# Patient Record
Sex: Male | Born: 1955 | Hispanic: No | Marital: Married | Smoking: Current every day smoker
Health system: Southern US, Community
[De-identification: ages and names within clinical notes are randomized; demographics above are authoritative.]

## PROBLEM LIST (undated history)

## (undated) DIAGNOSIS — F17210 Nicotine dependence, cigarettes, uncomplicated: Secondary | ICD-10-CM

## (undated) DIAGNOSIS — G8929 Other chronic pain: Secondary | ICD-10-CM

## (undated) DIAGNOSIS — M79606 Pain in leg, unspecified: Secondary | ICD-10-CM

## (undated) HISTORY — DX: Pain in leg, unspecified: M79.606

## (undated) HISTORY — DX: Nicotine dependence, cigarettes, uncomplicated: F17.210

## (undated) HISTORY — DX: Other chronic pain: G89.29

---

## 2000-02-17 ENCOUNTER — Ambulatory Visit (HOSPITAL_COMMUNITY): Admission: RE | Admit: 2000-02-17 | Discharge: 2000-02-17 | Payer: Self-pay | Admitting: Gastroenterology

## 2005-10-15 ENCOUNTER — Emergency Department (HOSPITAL_COMMUNITY): Admission: EM | Admit: 2005-10-15 | Discharge: 2005-10-15 | Payer: Self-pay | Admitting: Emergency Medicine

## 2005-11-11 ENCOUNTER — Encounter: Admission: RE | Admit: 2005-11-11 | Discharge: 2005-12-15 | Payer: Self-pay | Admitting: Orthopedic Surgery

## 2007-04-12 ENCOUNTER — Emergency Department (HOSPITAL_COMMUNITY): Admission: EM | Admit: 2007-04-12 | Discharge: 2007-04-12 | Payer: Self-pay | Admitting: Emergency Medicine

## 2009-02-04 ENCOUNTER — Observation Stay (HOSPITAL_COMMUNITY): Admission: EM | Admit: 2009-02-04 | Discharge: 2009-02-05 | Payer: Self-pay | Admitting: Emergency Medicine

## 2009-02-25 ENCOUNTER — Encounter: Admission: RE | Admit: 2009-02-25 | Discharge: 2009-02-25 | Payer: Self-pay | Admitting: Family Medicine

## 2009-04-08 ENCOUNTER — Encounter: Admission: RE | Admit: 2009-04-08 | Discharge: 2009-04-08 | Payer: Self-pay | Admitting: Neurology

## 2010-09-11 LAB — CARDIAC PANEL(CRET KIN+CKTOT+MB+TROPI)
CK, MB: 1.7 ng/mL (ref 0.3–4.0)
Relative Index: INVALID (ref 0.0–2.5)

## 2010-09-11 LAB — CBC
HCT: 40.5 % (ref 39.0–52.0)
MCHC: 33.5 g/dL (ref 30.0–36.0)
MCV: 88.3 fL (ref 78.0–100.0)
Platelets: 197 10*3/uL (ref 150–400)

## 2010-09-11 LAB — BASIC METABOLIC PANEL
BUN: 8 mg/dL (ref 6–23)
Calcium: 8.6 mg/dL (ref 8.4–10.5)
Chloride: 107 mEq/L (ref 96–112)
Creatinine, Ser: 0.94 mg/dL (ref 0.4–1.5)
Sodium: 138 mEq/L (ref 135–145)

## 2010-09-11 LAB — DIFFERENTIAL
Basophils Absolute: 0 10*3/uL (ref 0.0–0.1)
Lymphocytes Relative: 19 % (ref 12–46)
Lymphs Abs: 2.1 10*3/uL (ref 0.7–4.0)
Monocytes Relative: 7 % (ref 3–12)
Neutrophils Relative %: 73 % (ref 43–77)

## 2010-09-12 LAB — DIFFERENTIAL
Basophils Absolute: 0 10*3/uL (ref 0.0–0.1)
Eosinophils Absolute: 0 10*3/uL (ref 0.0–0.7)
Eosinophils Relative: 0 % (ref 0–5)
Lymphocytes Relative: 6 % — ABNORMAL LOW (ref 12–46)
Lymphs Abs: 0.9 10*3/uL (ref 0.7–4.0)
Neutro Abs: 14.3 10*3/uL — ABNORMAL HIGH (ref 1.7–7.7)

## 2010-09-12 LAB — URINALYSIS, ROUTINE W REFLEX MICROSCOPIC
Hgb urine dipstick: NEGATIVE
Ketones, ur: NEGATIVE mg/dL
Nitrite: NEGATIVE
Protein, ur: 30 mg/dL — AB
Specific Gravity, Urine: 1.016 (ref 1.005–1.030)
pH: 7 (ref 5.0–8.0)

## 2010-09-12 LAB — COMPREHENSIVE METABOLIC PANEL
Albumin: 3.8 g/dL (ref 3.5–5.2)
Chloride: 103 mEq/L (ref 96–112)
Creatinine, Ser: 0.94 mg/dL (ref 0.4–1.5)
GFR calc Af Amer: >60 mL/min (ref >60)
GFR calc non Af Amer: >60 mL/min (ref >60)
Glucose, Bld: 122 mg/dL — ABNORMAL HIGH (ref 70–99)
Potassium: 4.4 mEq/L (ref 3.5–5.1)
Total Bilirubin: 0.8 mg/dL (ref 0.3–1.2)

## 2010-09-12 LAB — PROTIME-INR
INR: 1 (ref 0.00–1.49)
Prothrombin Time: 13.2 seconds (ref 11.6–15.2)

## 2010-09-12 LAB — POCT CARDIAC MARKERS
CKMB, poc: 2.3 ng/mL (ref 1.0–8.0)
Troponin i, poc: <0.05 ng/mL (ref 0.00–0.09)

## 2010-09-12 LAB — CBC
Hemoglobin: 15.2 g/dL (ref 13.0–17.0)
MCV: 88 fL (ref 78.0–100.0)
RBC: 5.11 MIL/uL (ref 4.22–5.81)
WBC: 15.8 10*3/uL — ABNORMAL HIGH (ref 4.0–10.5)

## 2010-09-12 LAB — URINE MICROSCOPIC-ADD ON

## 2010-09-12 LAB — CARDIAC PANEL(CRET KIN+CKTOT+MB+TROPI): CK, MB: 2 ng/mL (ref 0.3–4.0)

## 2010-10-20 NOTE — Discharge Summary (Signed)
NAMEDEMETRICE, Nathaniel George              ACCOUNT NO.:  [id number]   MEDICAL RECORD NO.:  [id number]          PATIENT TYPE:  OBV   LOCATION:  1511                         FACILITY:  [facility]   PHYSICIAN:  Richarda Overlie, MD       DATE OF BIRTH:  08-23-1955   DATE OF ADMISSION:  02/04/2009  DATE OF DISCHARGE:  02/05/2009                               DISCHARGE SUMMARY   PRIMARY CARE PHYSICIAN:  Dr. Azucena Cecil.   DISCHARGE DIAGNOSES:  1. Community acquired pneumonia.  2. Chronic vertigo.  3. Nicotine dependence.  4. Dehydration.   PROCEDURES:  CT scan of the head without contrast shows post occipital  craniotomy no acute interval change.  Chest x-ray done on February 04, 2009, shows suboptimal level of inspiration and slight interval increase  in bibasilar subsegmental atelectasis versus pneumonia.  MRI / MRA of  the brain results pending.  2-D echo results pending.   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  The patient was found to be mildly tachypneic  in the ER.  Initial blood work revealed leukocytosis.  When the patient  presented he also complained of near syncope and a spinning sensation.  Chest x-ray was consistent with pneumonia.  The patient also notes some  intercurrent fatigue and weakness ever since he was started on a new  antidepressant about 2 weeks ago.  The patient states that he has had  extensive prior workup for his vertigo except for an MRI of the brain  which was essentially negative.  He has never seen an ENT specialist.   HOSPITAL COURSE:  1. Community acquired pneumonia.  The patient was found to have      bibasilar infiltrates consistent with community acquired pneumonia,      in the setting of his elevated white count he was initiated on      Rocephin and azithromycin.  The patient  responded well to treatment      with decrease in his white blood cell count.  He was hydrated      aggressively with IV fluids.  His chest pain was thought to be      secondary to his pneumonia.  EKG was consistent with sinus rhythm      without any ST segment changes.  The patient definitely has risk      factors to develop, the patient definitely has risk factors to have      underlying coronary artery disease.  The patient would benefit from      an outpatient Cardiolite stress test in about 2-3 weeks.  This      needs to be arranged by the patient's primary care Nathaniel George.   1. Dizziness.  The patient has a history of dizziness which is      chronic.  He has never had an outpatient evaluation.  During his      hospital stay the patient had  stable neuro checks.  He was      ambulated without recurrence of his symptoms.  He is being started      on p.r.n. meclizine.  An MRI / MRA of the brain is also being done      prior to his discharge.  I am also going to obtain a 2-D echo to      rule out any cardiac cause of his underlying dizziness and fatigue.      There was no evidence of any dysrhythmias on his telemetry during      this hospitalization.   DISCHARGE MEDICATIONS:  1. Meclizine 12.5 mg p.o. q.6 hours.  2. Albuterol MDI 2 puffs q.4 hours.  3. Avelox 400 mg p.o. daily.  4. Nicotine patch 21 mg per day x2 weeks, 14 mg per day x 6 weeks, 7      mg per day x2 weeks.  5. Mucinex DM 600 mg p.o. b.i.d.  6. Celexa 20 mg p.o. daily.  7. Klonopin 0.25-0.5 mg twice daily as needed.  8. Fish oil.  9. Generic Darvocet-N one tablet p.o. q.6 hours.   FOLLOWUP:  1. The patient is to have an outpatient stress test in 2-3 weeks once      the patient has recovered from his pneumonia.  This is to be      arranged by the patient's primary care Nathaniel George.  2. Follow up with PCP, Dr. Tally George, in 5-7 days.      Richarda Overlie, MD  Electronically Signed     NA/MEDQ  D:  02/05/2009  T:   02/05/2009  Job:  [id number]   cc:   Nathaniel George, M.D.  Fax: [phone]

## 2010-10-20 NOTE — H&P (Signed)
Nathaniel George, Nathaniel NO.:  [id number]   MEDICAL RECORD NO.:  [id number]          PATIENT TYPE:  EMS   LOCATION:  ED                           FACILITY:  [facility]   PHYSICIAN:  Rito Ehrlich, [facility], Dr.DATE OF BIRTH:  02-21-1956   DATE OF ADMISSION:  02/04/2009  DATE OF DISCHARGE:                              HISTORY & PHYSICAL   PRIMARY CARE PHYSICIAN:  Tally Joe, M.D. with [facility].   CHIEF COMPLAINT:  Chest pain, weakness and dizziness.   HISTORY OF PRESENT ILLNESS:  Nathaniel George is a 55 year old male patient  with history of ongoing tobacco abuse, depression and anxiety.  He  presents to the ER today, was brought in by EMS after experiencing  weakness associated with diaphoresis while at work.  This was also  associated with near syncope and a spinning type sensation during the  dizziness.  The patient and wife both report that he had a similar  episode last year but apparently this one is worse and has caused him to  seek medical attention.  In addition, he reports that while at the beach  a few days ago he had some nonspecific chest pain while walking up the  stairs.  He thinks he may have been a little short of breath and tight  in his chest, but was very vague about his  symptomatology and does not  recall any radiation of the pain.  Apparently the pain resolved when he  stopped walking up steps.  The patient also reports that he has been  recently started on a new antidepressant about 2 weeks ago and has  noticed concurrent increasing fatigue symptoms since that time.  Upon  arrival to the ER the patient underwent an extensive workup involving a  CT of the head which showed no acute process.  EKG showed no acute  ischemic changes.  Labs revealed a leukocytosis with a white count of  15,800 and a neutrophil shift to 91%.  A two-view chest x-ray showed  segmental bibasilar atelectasis and/or atelectatic pneumonia.  Dr.  Ignacia Palma of the ER  has asked [place] to evaluate the patient for admission.   REVIEW OF SYSTEMS:  CONSTITUTIONAL:  The patient denies any myalgias,  fevers or chills, malaise/fatigue as described in the history of present  illness.  PSYCH:  As noted, the patient has started a new medication.  He has chronic anxiety and depression.  No new social stressors  reported.  NEURO:  Dizziness and vertigo-like spinning dizziness today  at work.  Does not have this chronically.  Has had no visual  disturbances.  No unilateral extremity weakness, numbness or tingling.  No facial asymmetry.  No seizures.  EYES/EARS/NOSE/THROAT.  He has had  no conjunctival type drainage from the eyes.  No ear pain or ear  popping.  No nasal drainage.  No sinus pressure.  CHEST.  He denies  cough, hemoptysis, shortness of breath except he has had some shortness  of breath as described in the history of present illness with the chest  pain while at the beach.  CARDIOVASCULAR:  Chest  pain as described at  the beach. No tachy palpitations.  No orthopnea.  No dyspnea on exertion  other than the again shortness of breath reported with the chest pain at  the beach.  No chest pain today.  ABDOMEN:  No nausea, vomiting, no  diarrhea.  No hematemesis, no hematochezia or melena.  No change in  bowel pattern.  MUSCULOSKELETAL:  Noncontributory to this exam.  LYMPHATICS:  Noncontributory to this exam.  HEMATOLOGY:  Noncontributory  to this exam.   SOCIAL HISTORY:  He admits to smoking at least one pack of cigarettes  per day.  No alcohol.  He is married.  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.   FAMILY HISTORY:  His biological mother has renal disease, Alzheimer's.   PAST MEDICAL HISTORY:  1. Anxiety and depression.  2. Ongoing tobacco abuse.   PAST SURGICAL HISTORY:  Repair of an Arnold Chiari syndrome in 1996.   ALLERGIES:  No known drug allergies.   CURRENT MEDICATIONS:  At home include:  1. Celexa 20 mg daily.   2. Clonazepam 0.25-0.5 mg b.i.d. as needed for anxiety.  Reports this      has been given to assist with any side effects from the newly      ordered Celexa.  3. Generic Darvocet as needed for pain and headache.  4. Fish oil daily.   PHYSICAL EXAM:  GENERAL:  Pleasant male patient reporting recent  dizziness, diaphoresis and near syncopal symptoms.  VITAL SIGNS:  Temperature 97.1, BP 136/92, pulse 79 and regular,  respirations 20.  PSYCH:  Patient with flat affect but eye contact is made.  He is alert,  oriented x3.  NEURO:  Cranial nerves II-XII are grossly intact without any focal  neurological deficits noted.  He is moving all extremities x4.  Gait,  ambulation and DTRs were not assessed.  No nystagmus was noted as well  on exam.  EYES:  Sclerae noninjected, nonicteric.  Conjunctiva is pink.  EARS, NOSE/THROAT:  Ears are symmetrical without any otorrhea.  Left  tympanic membrane with few bubbles behind the TM.  No cerumen in the ear  canals.  Nares pink and without drainage or bogginess.  Posterior  pharynx is slightly reddened without any exudate.  With rapid movement  of the head I was unable to reproduce any vertigo symptoms.  CHEST:  Bilateral lung sounds are clear to auscultation anteriorly.  Posteriorly he has occasional scattered expiratory wheeze.  Questionable  expiratory rhonchi left mid field but he does have bibasilar crackles  which are faint.  He is currently on O2 sating 97%.  He is not  tachypneic and nonlabored at rest.  CARDIOVASCULAR:  Heart sounds S1, S2.  No rubs, murmurs, thrills or  gallops.  No JVD.  Pulses regular, not tachycardiac.  There is no  peripheral edema.  ABDOMEN:  Soft, nontender, nondistended without hepatosplenomegaly,  masses or bruits.  GENITOURINARY:  Exam was deferred.  MUSCULOSKELETAL:  Extremities are symmetrical in appearance without  cyanosis or clubbing.  LYMPHATICS:  There is no palpable adenopathy in the anterior cervical,   mandibular or preauricular areas.   LABORATORIES:  White count 15,800, hemoglobin 15.2, platelets 214,000,  neutrophils 91%.  Urinalysis is negative.  PT 13.2, INR 1.0, PTT 26.   DIAGNOSTICS:  Chest x-ray as noted in the history of present illness.  EKG demonstrates sinus rhythm, no ectopy, ventricular rate 71, no  ischemic ST changes.   IMPRESSION:  1.  Bibasilar community-acquired pneumonia, symptomatic.  2. Dizziness, weakness and near-syncope symptoms with negative CT of      the head.  3. Leukocytosis secondary to pneumonia.  4. Probable volume depletion.  5. Recent exertional chest pain in a patient that smokes.  Rule out      ischemic issues.  6. Polycythemia.  7. Anxiety and depression.   PLAN:  1. Admit the patient to the observation floor to the [facility].  2. Treat as community-acquired pneumonia.  Begin IV Rocephin and      Zithromax.  If patient otherwise stabilizes in the morning and no      further dizziness or other issues to remain hospitalized, he will      discharge home on oral antibiotics to follow up with his primary      care physician.  3. Rehydrate with IV fluids at 25 mL an hour and allow oral diet.      Suspect the polycythemia is secondary to either volume depletion or      ongoing chronic tobacco abuse.  4. Followup laboratories in the morning.  The patient did not have a      BMET checked so we will go ahead and check one for baseline.      Repeat complete blood count.  5. Given recent chest pain and the fact that the patient is a smoker,      we will cycle his cardiac enzymes.  Have a low index of suspicion      that this is an ischemic event and is more likely related to the      underlying community-acquired pneumonia. 6.  Gastrointestinal      prophylaxis with oral Protonix.  6. Deep vein thrombosis prophylaxis with subcutaneous Lovenox.      Allison L. Rennis Harding, N.P.    ______________________________   Rito Ehrlich, [facility], Dr.    Wynelle Link  D:  02/04/2009  T:  02/04/2009  Job:  [id number]   cc:   Tally Joe, M.D.  Fax: [phone]

## 2010-10-23 NOTE — Procedures (Signed)
[place]  Patient:    Nathaniel George, Nathaniel George                     MRN: [id number] Proc. Date: 02/17/00 Adm. Date:  [id number] Attending:  Louie Bun CC:         Dr. Karie Chimera   Procedure Report  PROCEDURE PERFORMED:  Colonoscopy.  ENDOSCOPIST:  Everardo All. Madilyn Fireman, M.D.  INDICATIONS FOR PROCEDURE:  Intermittent rectal bleeding and abdominal pain in a 55 year old patient with no obvious perianal source.  DESCRIPTION OF PROCEDURE:  The patient was placed in the left lateral decubitus position and placed on the pulse monitor with continuous low flow oxygen delivered by nasal cannula.  He was sedated with 50 mg IV Demerol and 7 mg IV Versed.  The Olympus video colonoscope was inserted into the rectum and advanced to the cecum, confirmed by transillumination of McBurneys point and visualization of the ICV and appendiceal orifice.  The prep was fair but there were some areas that required significant lavage and I was unable to rule out lesions less than 5 mm in all areas.  Otherwise, the cecum, ascending, transverse, descending and sigmoid colon all appeared normal with no masses, polyps, diverticula or other mucosal abnormalities.  The rectum likewise appeared normal and retroflex view of the anus did reveal some small internal hemorrhoids.  The colonoscope was then withdrawn and the patient returned to the recovery room in stable condition.  The patient tolerated the procedure well and there were no immediate complications.  IMPRESSION:  Internal hemorrhoids, otherwise normal colonoscopy. DD:  02/17/00 TD:  02/18/00 Job: [id number]

## 2011-03-16 LAB — URINALYSIS, ROUTINE W REFLEX MICROSCOPIC
Nitrite: NEGATIVE
Specific Gravity, Urine: 1.012
Urobilinogen, UA: 0.2
pH: 7

## 2011-03-16 LAB — DIFFERENTIAL
Basophils Absolute: 0
Basophils Relative: 0
Lymphocytes Relative: 10 — ABNORMAL LOW
Neutro Abs: 10 — ABNORMAL HIGH
Neutrophils Relative %: 86 — ABNORMAL HIGH

## 2011-03-16 LAB — COMPREHENSIVE METABOLIC PANEL
BUN: 11
CO2: 29
Chloride: 101
Creatinine, Ser: 0.93
GFR calc non Af Amer: >60
Glucose, Bld: 133 — ABNORMAL HIGH
Total Bilirubin: 0.9

## 2011-03-16 LAB — CBC
HCT: 44.5
Hemoglobin: 15.4
MCHC: 34.5
RBC: 5.23

## 2013-10-26 ENCOUNTER — Emergency Department (HOSPITAL_COMMUNITY)
Admission: EM | Admit: 2013-10-26 | Discharge: 2013-10-26 | Disposition: A | Discharge status: Home / Self Care | Payer: Self-pay | Attending: Emergency Medicine | Admitting: Emergency Medicine

## 2013-10-26 ENCOUNTER — Encounter (HOSPITAL_COMMUNITY): Payer: Self-pay | Admitting: Emergency Medicine

## 2013-10-26 DIAGNOSIS — Z79899 Other long term (current) drug therapy: Secondary | ICD-10-CM | POA: Insufficient documentation

## 2013-10-26 DIAGNOSIS — R030 Elevated blood-pressure reading, without diagnosis of hypertension: Secondary | ICD-10-CM | POA: Insufficient documentation

## 2013-10-26 DIAGNOSIS — F17200 Nicotine dependence, unspecified, uncomplicated: Secondary | ICD-10-CM | POA: Insufficient documentation

## 2013-10-26 DIAGNOSIS — F430 Acute stress reaction: Secondary | ICD-10-CM | POA: Insufficient documentation

## 2013-10-26 LAB — CBC WITH DIFFERENTIAL/PLATELET
BASOS ABS: 0 10*3/uL (ref 0.0–0.1)
Basophils Relative: 1 % (ref 0–1)
EOS PCT: 2 % (ref 0–5)
Eosinophils Absolute: 0.1 10*3/uL (ref 0.0–0.7)
HEMATOCRIT: 53.1 % — AB (ref 39.0–52.0)
Hemoglobin: 18.4 g/dL — ABNORMAL HIGH (ref 13.0–17.0)
LYMPHS PCT: 22 % (ref 12–46)
Lymphs Abs: 1.8 10*3/uL (ref 0.7–4.0)
MCH: 30.6 pg (ref 26.0–34.0)
MCHC: 34.7 g/dL (ref 30.0–36.0)
MCV: 88.4 fL (ref 78.0–100.0)
MONO ABS: 0.7 10*3/uL (ref 0.1–1.0)
Monocytes Relative: 9 % (ref 3–12)
Neutro Abs: 5.3 10*3/uL (ref 1.7–7.7)
Neutrophils Relative %: 66 % (ref 43–77)
Platelets: 190 10*3/uL (ref 150–400)
RBC: 6.01 MIL/uL — ABNORMAL HIGH (ref 4.22–5.81)
RDW: 13.1 % (ref 11.5–15.5)
WBC: 8 10*3/uL (ref 4.0–10.5)

## 2013-10-26 LAB — BASIC METABOLIC PANEL
BUN: 6 mg/dL (ref 6–23)
CALCIUM: 9.5 mg/dL (ref 8.4–10.5)
CO2: 25 meq/L (ref 19–32)
CREATININE: 0.79 mg/dL (ref 0.50–1.35)
Chloride: 99 mEq/L (ref 96–112)
GFR calc Af Amer: >90 mL/min (ref >90)
GFR calc non Af Amer: >90 mL/min (ref >90)
GLUCOSE: 106 mg/dL — AB (ref 70–99)
Potassium: 4 mEq/L (ref 3.7–5.3)
Sodium: 136 mEq/L — ABNORMAL LOW (ref 137–147)

## 2013-10-26 LAB — I-STAT TROPONIN, ED: TROPONIN I, POC: 0 ng/mL (ref 0.00–0.08)

## 2013-10-26 NOTE — ED Provider Notes (Signed)
CSN: [id number]     Arrival date & time 10/26/13  2028 History   First MD Initiated Contact with Patient 10/26/13 2057     Chief Complaint  Patient presents with  . Hypertension     (Consider location/radiation/quality/duration/timing/severity/associated sxs/prior Treatment) The history is provided by the patient and medical records.   This is a 58 year old male with no significant past medical history presenting to the ED for high blood pressure. [facility] and thought a lot the automated machines to check his blood pressure, states reading told him his blood pressure was extremely high risk for stroke so he decided to come to the ED. He states he can already at number, but states one of the numbers was "122".  Pt has no prior hx of HTN.  Patient does say he's been under a great deal of stress-- he recently lost his job unexpectedly, has custody of his 3 young grandchildren, and currently works 2 jobs, and does not get much sleep.  He denies any chest pain, shortness of breath, palpitations, dizziness, weakness, changes in speech, or ataxia.  Patient has no prior cardiac history. He does state he has been more fatigued with decreased energy lately.    History reviewed. No pertinent past medical history. History reviewed. No pertinent past surgical history. No family history on file. History  Substance Use Topics  . Smoking status: Current Every Day Smoker  . Smokeless tobacco: Not on file  . Alcohol Use: Yes    Review of Systems  Constitutional:       High BP  All other systems reviewed and are negative.     Allergies  Review of patient's allergies indicates no known allergies.  Home Medications   Prior to Admission medications   Medication Sig Start Date End Date Taking? Authorizing Provider  ranitidine (ZANTAC) 150 MG tablet Take 150 mg by mouth 2 (two) times daily.   Yes Historical Provider, MD  thiamine (VITAMIN B-1) 100 MG tablet Take 100 mg by  mouth 2 (two) times daily.   Yes Historical Provider, MD   BP 165/98  Pulse 81  Temp(Src) 97.9 F (36.6 C) (Oral)  Resp 18  SpO2 100%  Physical Exam  Nursing note and vitals reviewed. Constitutional: He is oriented to person, place, and time. He appears well-developed and well-nourished.  HENT:  Head: Normocephalic and atraumatic.  Mouth/Throat: Oropharynx is clear and moist.  Eyes: Conjunctivae and EOM are normal. Pupils are equal, round, and reactive to light.  Neck: Normal range of motion.  Cardiovascular: Normal rate, regular rhythm and normal heart sounds.   Pulmonary/Chest: Effort normal and breath sounds normal.  Abdominal: Soft. Bowel sounds are normal. There is no tenderness. There is no guarding.  Musculoskeletal: Normal range of motion.  Neurological: He is alert and oriented to person, place, and time.  AAOx3, answering questions appropriately; equal strength UE and LE bilaterally; CN grossly intact; moves all extremities appropriately without ataxia; no focal neuro deficits or facial asymmetry appreciated  Skin: Skin is warm and dry.  Psychiatric: He has a normal mood and affect.    ED Course  Procedures (including critical care time) Labs Review Labs Reviewed  CBC WITH DIFFERENTIAL - Abnormal; Notable for the following:    RBC 6.01 (*)    Hemoglobin 18.4 (*)    HCT 53.1 (*)    All other components within normal limits  BASIC METABOLIC PANEL - Abnormal; Notable for the following:    Sodium 136 (*)  Glucose, Bld 106 (*)    All other components within normal limits  I-STAT TROPOININ, ED    Imaging Review No results found.   EKG Interpretation None      MDM   Final diagnoses:  Elevated blood pressure reading   58 year old male presenting to the ED for elevated blood pressure. He has no history of hypertension. On arrival to the ED his blood pressure is 165/98. On exam he has no focal neurologic deficits to suggest TIA, stroke, ICH, or SAH.  Will  obtain basic labs, EKG, troponin.  EKG NSR without ischemic change.  Trop negative.  Labs are reassuring.  No evidence of end organ damage on exam today.  Neuro exam remains non-focal.  Pt does have elevated BP but not in hypertensive urgency/emergency range.  Will have him FU with his PCP next week for re-check.  Discussed plan with patient, he/she acknowledged understanding and agreed with plan of care.  Return precautions given for new or worsening symptoms.  Garlon Hatchet, PA-C 10/26/13 2347

## 2013-10-26 NOTE — ED Notes (Signed)
Patient is alert and oriented x3.  He is here to be seen for high blood pressure.  He states that he has not ever been seen for this issue before.  He states  That he was in [place] today and checked his BP and was told that he  Needed to seek medical attention.  Currently he denies any pain but states That he has been more tired than normal.

## 2013-10-26 NOTE — Discharge Instructions (Signed)
i have attached copies of your labs for your physician review. Follow up with your primary care physician in 1 week for re-check of your blood pressure. Return to the ED for new concerns.

## 2013-10-27 NOTE — ED Provider Notes (Signed)
Medical screening examination/treatment/procedure(s) were performed by non-physician practitioner and as supervising physician I was immediately available for consultation/collaboration.   EKG Interpretation None        Lyanne Co, MD 10/27/13 [phone]

## 2016-10-21 ENCOUNTER — Ambulatory Visit
Admission: RE | Admit: 2016-10-21 | Discharge: 2016-10-21 | Disposition: A | Discharge status: Home / Self Care | Payer: 59 | Source: Ambulatory Visit | Attending: Family Medicine | Admitting: Family Medicine

## 2016-10-21 ENCOUNTER — Other Ambulatory Visit: Payer: Self-pay | Admitting: Family Medicine

## 2016-10-21 DIAGNOSIS — R05 Cough: Secondary | ICD-10-CM

## 2016-10-21 DIAGNOSIS — R059 Cough, unspecified: Secondary | ICD-10-CM

## 2016-12-02 ENCOUNTER — Emergency Department (HOSPITAL_BASED_OUTPATIENT_CLINIC_OR_DEPARTMENT_OTHER): Payer: 59

## 2016-12-02 ENCOUNTER — Encounter (HOSPITAL_BASED_OUTPATIENT_CLINIC_OR_DEPARTMENT_OTHER): Payer: Self-pay | Admitting: *Deleted

## 2016-12-02 DIAGNOSIS — F17210 Nicotine dependence, cigarettes, uncomplicated: Secondary | ICD-10-CM | POA: Diagnosis not present

## 2016-12-02 DIAGNOSIS — S83004A Unspecified dislocation of right patella, initial encounter: Secondary | ICD-10-CM | POA: Diagnosis not present

## 2016-12-02 DIAGNOSIS — Y929 Unspecified place or not applicable: Secondary | ICD-10-CM | POA: Diagnosis not present

## 2016-12-02 DIAGNOSIS — Y999 Unspecified external cause status: Secondary | ICD-10-CM | POA: Diagnosis not present

## 2016-12-02 DIAGNOSIS — X503XXA Overexertion from repetitive movements, initial encounter: Secondary | ICD-10-CM | POA: Insufficient documentation

## 2016-12-02 DIAGNOSIS — S8991XA Unspecified injury of right lower leg, initial encounter: Secondary | ICD-10-CM | POA: Diagnosis present

## 2016-12-02 DIAGNOSIS — Y939 Activity, unspecified: Secondary | ICD-10-CM | POA: Insufficient documentation

## 2016-12-02 NOTE — ED Triage Notes (Signed)
States his right knee "pops in and out of the socket" normally he can get it back but this time he cannot.

## 2016-12-03 ENCOUNTER — Emergency Department (HOSPITAL_BASED_OUTPATIENT_CLINIC_OR_DEPARTMENT_OTHER)
Admission: EM | Admit: 2016-12-03 | Discharge: 2016-12-03 | Disposition: A | Discharge status: Home / Self Care | Payer: 59 | Attending: Emergency Medicine | Admitting: Emergency Medicine

## 2016-12-03 DIAGNOSIS — S83004A Unspecified dislocation of right patella, initial encounter: Secondary | ICD-10-CM

## 2016-12-03 MED ORDER — HYDROCODONE-ACETAMINOPHEN 5-325 MG PO TABS: 1.0000 | ORAL_TABLET | Freq: Four times a day (QID) | ORAL | 0 refills | Status: DC | PRN

## 2016-12-03 MED ORDER — NAPROXEN 250 MG PO TABS
500.0000 mg | ORAL_TABLET | Freq: Once | ORAL | Status: AC
  Administered 2016-12-03: 500 mg via ORAL
  Filled 2016-12-03: qty 2

## 2016-12-03 NOTE — ED Notes (Addendum)
Right knee cap has the same problem a week ago but he was able to fix it.  He popped it back on and he used a knee brace that he bought from [employer].   This time, while he was trying to connect the pressure washer, his knee just popped out and he stated that he can not put it back on. Right knee is slightly swollen compared to LLE.

## 2016-12-03 NOTE — ED Provider Notes (Signed)
[facility] Provider Note: Lowella DellJ. Lane Komal Stangelo, MD, FACEP  CSN: [id number] MRN: [id number] ARRIVAL: 12/02/16 at 2257 ROOM: MH03/MH03   CHIEF COMPLAINT  Leg Injury   HISTORY OF PRESENT ILLNESS  Nathaniel George is a 61 y.o. male with about a one-week history of intermittent dislocation of his right patella. He has usually been able to "pop it back into place" but yesterday evening when he bent down to pick something up it popped out of place and would not relocate immediately. There is an associated effusion of the right knee as well as pain on attempted flexion and extension. He rates his pain as an 8 out of 10. He has not taken anything for the pain.   [facility] with [facility] [place] substances [place] reveals the patient has received 3 prescriptions for tramadol the past year.   History reviewed. No pertinent past medical history.  History reviewed. No pertinent surgical history.  No family history on file.  Social History  Substance Use Topics  . Smoking status: Current Every Day Smoker  . Smokeless tobacco: Never Used  . Alcohol use Yes    Prior to Admission medications   Medication Sig Start Date End Date Taking? Authorizing Provider  ranitidine (ZANTAC) 150 MG tablet Take 150 mg by mouth 2 (two) times daily.    [provider]  thiamine (VITAMIN B-1) 100 MG tablet Take 100 mg by mouth 2 (two) times daily.    [provider]    Allergies Patient has no known allergies.   REVIEW OF SYSTEMS  Negative except as noted here or in the History of Present Illness.   PHYSICAL EXAMINATION  Initial Vital Signs Blood pressure 128/87, pulse 86, temperature 98.7 F (37.1 C), temperature source Oral, resp. rate 16, height 5\' 9"  (1.753 m), weight 88.5 kg (195 lb), SpO2 98 %.  Examination General: Well-developed, well-nourished male in no acute distress; appearance consistent with age of record HENT: normocephalic;  atraumatic Eyes: pupils equal, round and reactive to light; extraocular muscles intact Neck: supple Heart: regular rate and rhythm Lungs: clear to auscultation bilaterally Abdomen: soft; nondistended; nontender; bowel sounds present Extremities: No deformity; full range of motion except right knee; pulses normal; right knee effusion with pain on attempted flexion and extension Neurologic: Awake, alert and oriented; motor function intact in all extremities and symmetric; no facial droop Skin: Warm and dry Psychiatric: Normal mood and affect   RESULTS  Summary of this visit's results, reviewed by myself:   EKG Interpretation  Date/Time:    Ventricular Rate:    PR Interval:    QRS Duration:   QT Interval:    QTC Calculation:   R Axis:     Text Interpretation:        Laboratory Studies: No results found for this or any previous visit (from the past 24 hour(s)). Imaging Studies: Dg Knee Complete 4 Views Right  Result Date: 12/02/2016 CLINICAL DATA:  Pain tonight while kneeling. EXAM: RIGHT KNEE - COMPLETE 4+ VIEW COMPARISON:  None. FINDINGS: No evidence of fracture or dislocation. Possible small joint effusion. No evidence of arthropathy or other focal bone abnormality. Soft tissues are unremarkable. IMPRESSION: Possible small joint effusion.  Otherwise unremarkable. Electronically Signed   By: Ellery Plunkaniel R Mitchell M.D.   On: 12/02/2016 23:37    ED COURSE  Nursing notes and initial vitals signs, including pulse oximetry, reviewed.  Vitals:   12/02/16 2302 12/02/16 2304  BP:  128/87  Pulse:  86  Resp:  16  Temp:  98.7 F (37.1 C)  TempSrc:  Oral  SpO2:  98%  Weight: 88.5 kg (195 lb)   Height: 5\' 9"  (1.753 m)     PROCEDURES    ED DIAGNOSES     ICD-10-CM   1. Patellar dislocation, right, initial encounter S83.004A        Eugina Row, Jonny Ruiz, MD 12/03/16 [phone]

## 2017-10-28 ENCOUNTER — Ambulatory Visit (INDEPENDENT_AMBULATORY_CARE_PROVIDER_SITE_OTHER): Payer: 59 | Admitting: Family Medicine

## 2017-10-28 ENCOUNTER — Encounter: Payer: Self-pay | Admitting: Family Medicine

## 2017-10-28 VITALS — BP 140/90 | HR 83 | Ht 68.0 in | Wt 186.8 lb

## 2017-10-28 DIAGNOSIS — Z23 Encounter for immunization: Secondary | ICD-10-CM | POA: Diagnosis not present

## 2017-10-28 DIAGNOSIS — F17209 Nicotine dependence, unspecified, with unspecified nicotine-induced disorders: Secondary | ICD-10-CM | POA: Insufficient documentation

## 2017-10-28 DIAGNOSIS — M25522 Pain in left elbow: Secondary | ICD-10-CM

## 2017-10-28 DIAGNOSIS — F17210 Nicotine dependence, cigarettes, uncomplicated: Secondary | ICD-10-CM | POA: Insufficient documentation

## 2017-10-28 DIAGNOSIS — M25521 Pain in right elbow: Secondary | ICD-10-CM | POA: Insufficient documentation

## 2017-10-28 MED ORDER — MELOXICAM 15 MG PO TABS: 15.0000 mg | ORAL_TABLET | Freq: Every day | ORAL | 3 refills | Status: DC

## 2017-10-28 MED ORDER — VARENICLINE TARTRATE 0.5 MG X 11 & 1 MG X 42 PO MISC: ORAL | 0 refills | Status: DC

## 2017-10-28 NOTE — Progress Notes (Signed)
Patient ID: Nathaniel George, male   DOB: 1956-05-04, 62 y.o.   MRN: [id number]  Nathaniel George - 62 y.o. male MRN [id number]  Date of birth: 01-May-1956  Subjective Chief Complaint  Patient presents with  . Joint Swelling    HPI Nathaniel George is a 62 y.o. male here today to establish care with new pcp.  He has complaint of bilateral elbow pain, R>L.  He is also interested in quitting smoking.  -Elbow pain:  R handed male reports he has had bilateral elbow pain for about 3-4 weeks.  Denies any recent injury or known overuse.  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  He was told in the past that he may have gout and started on indomethacin.  He denies any swelling of the joint or warmth.  He has had some improvement with indomethacin but often times will forget about taking additional doses throughout the day. He denies associated numbness, tingling or weakness or the arms.  -Nicotine dependence:  Smoking ~1 ppd x40 years.  Interested in quitting, thinks he would like to try chantix.  He has been able to cut back some by using chewing gum (non-nicotine).  He denies any respiratory symptoms and tells me that his pcp ordered a CT for lung cancer screening which was negative.   ROS: ROS completed and negative except as noted per HPI.   No Known Allergies  Past Medical History:  Diagnosis Date  . Chronic leg pain   . Nicotine dependence, cigarettes, uncomplicated     History reviewed. No pertinent surgical history.  Social History   Socioeconomic History  . Marital status: Married    Spouse name: Not on file  . Number of children: Not on file  . Years of education: Not on file  . Highest education level: Not on file  Occupational History  . Not on file  Social Needs  . Financial resource strain: Not on file  . Food insecurity:    Worry: Not on file    Inability: Not on file  . Transportation needs:    Medical: Not on file   Non-medical: Not on file  Tobacco Use  . Smoking status: Current Every Day Smoker  . Smokeless tobacco: Never Used  Substance and Sexual Activity  . Alcohol use: Yes  . Drug use: Not Currently  . Sexual activity: Not on file  Lifestyle  . Physical activity:    Days per week: Not on file    Minutes per session: Not on file  . Stress: Not on file  Relationships  . Social connections:    Talks on phone: Not on file    Gets together: Not on file    Attends religious service: Not on file    Active member of club or organization: Not on file    Attends meetings of clubs or organizations: Not on file    Relationship status: Not on file  Other Topics Concern  . Not on file  Social History Narrative  . Not on file    History reviewed. No pertinent family history.  Health Maintenance  Topic Date Due  . Hepatitis C Screening  15-Jan-1956  . HIV Screening  01/02/1973  . TETANUS/TDAP  01/02/1977  . COLONOSCOPY  01/03/2008  . INFLUENZA VACCINE  01/05/2018    ----------------------------------------------------------------------------------------------------------------------------------------------------------------------------------------------------------------- Physical Exam BP 140/90 (BP Location: Left Arm, Patient Position: Sitting, Cuff Size: Normal)   Pulse 83  Ht  (1.727 m)   Wt 186 lb 12.8 oz (84.7 kg)   BMI 28.40 kg/m   Physical Exam  Constitutional: He is oriented to person, place, and time. He appears well-nourished. No distress.  HENT:  Head: Normocephalic and atraumatic.  Mouth/Throat: Oropharynx is clear and moist.  Eyes: No scleral icterus.  Neck: Neck supple. Thyromegaly present.  Cardiovascular: Normal rate, regular rhythm, normal heart sounds and intact distal pulses.  Pulmonary/Chest: Effort normal and breath sounds normal.  Musculoskeletal: Normal range of motion. He exhibits tenderness (TTP along lateral joint line of bilateral elbows, R>L.  No  swelling, warmth or erythema noted.  No tophi noted. ). He exhibits no edema.  Lymphadenopathy:    He has no cervical adenopathy.  Neurological: He is alert and oriented to person, place, and time.  Skin: No rash noted.  Psychiatric: He has a normal mood and affect. His behavior is normal.    ------------------------------------------------------------------------------------------------------------------------------------------------------------------------------------------------------------------- Assessment and Plan  Cigarette nicotine dependence without complication >10 minutes counseling regarding tobacco cessation Reviewed treatment options for quitting smoking Rx for chantix prescribed, discussed setting quit date Follow up about 4-6 weeks to see how he is doing and for CPE  Bilateral elbow joint pain Likely 2/2 to OA Discontinue indomethacin, unlikely from gout Start meloxicam as I think this will be easier for him to manage with once per day dosing Recommend icing after strenuous activity.

## 2017-10-28 NOTE — Patient Instructions (Signed)
It was nice to meet you Schedule a time for physical with fasting lab work Try meloxicam in place of indomethacin, this is only once per day.  Set a quit date for smoking and stick to this date.     Arthritis Arthritis means joint pain. It can also mean joint disease. A joint is a place where bones come together. People who have arthritis may have:  Red joints.  Swollen joints.  Stiff joints.  Warm joints.  A fever.  A feeling of being sick.  Follow these instructions at home: Pay attention to any changes in your symptoms. Take these actions to help with your pain and swelling. Medicines  Take over-the-counter and prescription medicines only as told by your doctor.  Do not take aspirin for pain if your doctor says that you may have gout. Activity  Rest your joint if your doctor tells you to.  Avoid activities that make the pain worse.  Exercise your joint regularly as told by your doctor. Try doing exercises like: ? Swimming. ? Water aerobics. ? Biking. ? Walking. Joint Care   If your joint is swollen, keep it raised (elevated) if told by your doctor.  If your joint feels stiff in the morning, try taking a warm shower.  If you have diabetes, do not apply heat without asking your doctor.  If told, apply heat to the joint: ? Put a towel between the joint and the hot pack or heating pad. ? Leave the heat on the area for 20-30 minutes.  If told, apply ice to the joint: ? Put ice in a plastic bag. ? Place a towel between your skin and the bag. ? Leave the ice on for 20 minutes, 2-3 times per day.  Keep all follow-up visits as told by your doctor. Contact a doctor if:  The pain gets worse.  You have a fever. Get help right away if:  You have very bad pain in your joint.  You have swelling in your joint.  Your joint is red.  Many joints become painful and swollen.  You have very bad back pain.  Your leg is very weak.  You cannot control your pee  (urine) or poop (stool). This information is not intended to replace advice given to you by your health care provider. Make sure you discuss any questions you have with your health care provider. Document Released: 08/18/2009 Document Revised: 10/30/2015 Document Reviewed: 08/19/2014 Elsevier Interactive Patient Education  [employer].

## 2017-10-28 NOTE — Assessment & Plan Note (Signed)
>  10 minutes counseling regarding tobacco cessation Reviewed treatment options for quitting smoking Rx for chantix prescribed, discussed setting quit date Follow up about 4-6 weeks to see how he is doing and for CPE

## 2017-10-28 NOTE — Assessment & Plan Note (Signed)
Likely 2/2 to OA Discontinue indomethacin, unlikely from gout Start meloxicam as I think this will be easier for him to manage with once per day dosing Recommend icing after strenuous activity.

## 2017-12-02 ENCOUNTER — Ambulatory Visit: Payer: 59 | Admitting: Family Medicine

## 2017-12-02 ENCOUNTER — Encounter: Payer: Self-pay | Admitting: Family Medicine

## 2017-12-02 VITALS — BP 142/84 | HR 98 | Temp 98.9°F | Ht 68.0 in | Wt 181.0 lb

## 2017-12-02 DIAGNOSIS — J069 Acute upper respiratory infection, unspecified: Secondary | ICD-10-CM | POA: Diagnosis not present

## 2017-12-02 DIAGNOSIS — R21 Rash and other nonspecific skin eruption: Secondary | ICD-10-CM | POA: Insufficient documentation

## 2017-12-02 MED ORDER — DOXYCYCLINE HYCLATE 100 MG PO TABS: 100.0000 mg | ORAL_TABLET | Freq: Two times a day (BID) | ORAL | 0 refills | Status: DC

## 2017-12-02 NOTE — Assessment & Plan Note (Signed)
Does have rash after tick exposure. Possible for large localized reaction  - doxycycline  - given indications to follow up.

## 2017-12-02 NOTE — Progress Notes (Signed)
Nathaniel George - 62 y.o. male MRN [id number]  Date of birth: 04-08-1956  SUBJECTIVE:  Including CC & ROS.  Chief Complaint  Patient presents with  . Tick Removal    Nathaniel Fickerry L Juncaj is a 62 y.o. male that is presenting with a tick bite. He noticed a tick on the back of his calf two days ago. He removed it with tweezers. Admits to swelling, redness and tenderness. Denies fevers. Admits to body aches. He thinks the tick was attached 2-3 hours. The rash started the next day after removing the tick. No other history of tick bites.    He admits to ongoing cough that began three days ago. [place]. Admits to productive cough. He has been around his grandchildren with same symptoms. He has been taking honey daily.     Review of Systems  Constitutional: Negative for fever.  HENT: Positive for congestion.   Respiratory: Positive for cough.   Cardiovascular: Negative for chest pain.  Gastrointestinal: Negative for abdominal pain.  Musculoskeletal: Negative for gait problem.  Skin: Positive for rash.  Neurological: Negative for weakness.  Hematological: Negative for adenopathy.  Psychiatric/Behavioral: Negative for agitation.    HISTORY: Past Medical, Surgical, Social, and Family History Reviewed & Updated per EMR.   Pertinent Historical Findings include:  Past Medical History:  Diagnosis Date  . Chronic leg pain   . Nicotine dependence, cigarettes, uncomplicated     No past surgical history on file.  No Known Allergies  No family history on file.   Social History   Socioeconomic History  . Marital status: Married    Spouse name: Not on file  . Number of children: Not on file  . Years of education: Not on file  . Highest education level: Not on file  Occupational History  . Not on file  Social Needs  . Financial resource strain: Not on file  . Food insecurity:    Worry: Not on file    Inability: Not on file  . Transportation  needs:    Medical: Not on file    Non-medical: Not on file  Tobacco Use  . Smoking status: Current Every Day Smoker  . Smokeless tobacco: Never Used  Substance and Sexual Activity  . Alcohol use: Yes  . Drug use: Not Currently  . Sexual activity: Not on file  Lifestyle  . Physical activity:    Days per week: Not on file    Minutes per session: Not on file  . Stress: Not on file  Relationships  . Social connections:    Talks on phone: Not on file    Gets together: Not on file    Attends religious service: Not on file    Active member of club or organization: Not on file    Attends meetings of clubs or organizations: Not on file    Relationship status: Not on file  . Intimate partner violence:    Fear of current or ex partner: Not on file    Emotionally abused: Not on file    Physically abused: Not on file    Forced sexual activity: Not on file  Other Topics Concern  . Not on file  Social History Narrative  . Not on file     PHYSICAL EXAM:  VS: BP (!) 142/84 (BP Location: Left Arm, Patient Position: Sitting, Cuff Size: Normal)   Pulse 98   Temp 98.9 F (37.2 C) (Oral)   Ht 5'  8" (1.727 m)   Wt 181 lb (82.1 kg)   SpO2 99%   BMI 27.52 kg/m  Physical Exam Gen: NAD, alert, cooperative with exam, well-appearing ENT: normal lips, normal nasal mucosa, Eye: normal EOM, normal conjunctiva and lids CV:  no edema, +2 pedal pulses, regular rate and rhythm, S1-S2   Resp: no accessory muscle use, non-labored, clear to auscultation bilaterally, no crackles or wheezes Skin: central bite with confluent annular erythematous rash on posterior gastroc. No bulleyes, mild induration under the area of the bite Neuro: normal tone, normal sensation to touch Psych:  normal insight, alert and oriented MSK: Normal gait, normal strength        ASSESSMENT & PLAN:   Upper respiratory tract infection Likely viral in nature - counseled on supportive care   Rash Does have rash after  tick exposure. Possible for large localized reaction  - doxycycline  - given indications to follow up.

## 2017-12-02 NOTE — Assessment & Plan Note (Signed)
Likely viral in nature.  - counseled on supportive care 

## 2017-12-02 NOTE — Patient Instructions (Signed)
Please try things such as zyrtec-D or allegra-D which is an antihistamine and decongestant.   Please try afrin which will help with nasal congestion but use for only three days.   Please also try using a netti pot on a regular occasion.  Honey can help with a sore throat.   I have sent doxycycline in which is an antibiotic.

## 2018-02-13 DIAGNOSIS — M25512 Pain in left shoulder: Secondary | ICD-10-CM | POA: Insufficient documentation

## 2018-03-11 ENCOUNTER — Other Ambulatory Visit: Payer: Self-pay | Admitting: Family Medicine

## 2018-04-13 ENCOUNTER — Other Ambulatory Visit: Payer: Self-pay | Admitting: Family Medicine

## 2018-05-16 ENCOUNTER — Other Ambulatory Visit: Payer: Self-pay | Admitting: Family Medicine

## 2018-07-01 ENCOUNTER — Ambulatory Visit: Payer: 59 | Admitting: Family Medicine

## 2018-07-01 ENCOUNTER — Encounter: Payer: Self-pay | Admitting: Family Medicine

## 2018-07-01 VITALS — BP 152/86 | HR 96 | Temp 99.7°F | Ht 68.0 in | Wt 181.0 lb

## 2018-07-01 DIAGNOSIS — R05 Cough: Secondary | ICD-10-CM

## 2018-07-01 DIAGNOSIS — J101 Influenza due to other identified influenza virus with other respiratory manifestations: Secondary | ICD-10-CM | POA: Diagnosis not present

## 2018-07-01 DIAGNOSIS — R509 Fever, unspecified: Secondary | ICD-10-CM

## 2018-07-01 DIAGNOSIS — R062 Wheezing: Secondary | ICD-10-CM

## 2018-07-01 DIAGNOSIS — R0989 Other specified symptoms and signs involving the circulatory and respiratory systems: Secondary | ICD-10-CM

## 2018-07-01 DIAGNOSIS — R058 Other specified cough: Secondary | ICD-10-CM

## 2018-07-01 LAB — POC INFLUENZA A&B (BINAX/QUICKVUE)
INFLUENZA B, POC: NEGATIVE
Influenza A, POC: POSITIVE — AB

## 2018-07-01 LAB — POCT RAPID STREP A (OFFICE): RAPID STREP A SCREEN: NEGATIVE

## 2018-07-01 MED ORDER — ALBUTEROL SULFATE 108 (90 BASE) MCG/ACT IN AEPB: 2.0000 | INHALATION_SPRAY | RESPIRATORY_TRACT | 1 refills | Status: DC | PRN

## 2018-07-01 MED ORDER — OSELTAMIVIR PHOSPHATE 75 MG PO CAPS: 75.0000 mg | ORAL_CAPSULE | Freq: Two times a day (BID) | ORAL | 0 refills | Status: DC

## 2018-07-01 MED ORDER — AZITHROMYCIN 250 MG PO TABS: ORAL_TABLET | ORAL | 0 refills | Status: DC

## 2018-07-01 MED ORDER — METHYLPREDNISOLONE 4 MG PO TBPK: ORAL_TABLET | ORAL | 0 refills | Status: DC

## 2018-07-01 MED ORDER — GUAIFENESIN-CODEINE 100-10 MG/5ML PO SOLN
5.0000 mL | Freq: Four times a day (QID) | ORAL | 0 refills | Status: DC | PRN
Start: 2018-07-01 — End: 2019-01-16

## 2018-07-01 NOTE — Progress Notes (Signed)
Subjective:    Patient ID: Nathaniel George, male    DOB: 28-Oct-1955, 63 y.o.   MRN: [id number]  HPI  Patient presents to clinic complaining of body aches, fever, harsh productive cough that began yesterday.  Patient was putting around a lot of dust, states he did wear a mask however believes he did inhale a good amount of dust.  Patient denies any history of COPD, but is a current smoker.  States when he coughs, sometimes he will feel short of breath and wheezy.  Denies any chest pain.  Denies nausea/vomiting or diarrhea.  States he has used a Tylenol Cold med with some effect in helping to reduce his achiness.   Patient Active Problem List   Diagnosis Date Noted  . Rash 12/02/2017  . Upper respiratory tract infection 12/02/2017  . Cigarette nicotine dependence without complication 10/28/2017  . Bilateral elbow joint pain 10/28/2017   Social History   Tobacco Use  . Smoking status: Current Every Day Smoker  . Smokeless tobacco: Never Used  Substance Use Topics  . Alcohol use: Yes   Review of Systems  Constitutional: +chills, fatigue and fever.  HENT: +congestion, ear pain, sinus pain and sore throat.   Eyes: Negative.   Respiratory: +cough, shortness of breath and wheezing.   Cardiovascular: Negative for chest pain, palpitations and leg swelling.  Gastrointestinal: Negative for abdominal pain, diarrhea, nausea and vomiting.  Genitourinary: Negative for dysuria, frequency and urgency.  Musculoskeletal: Negative for arthralgias and myalgias.  Skin: Negative for color change, pallor and rash.  Neurological: Negative for syncope, light-headedness and headaches.  Psychiatric/Behavioral: The patient is not nervous/anxious.       Objective:   Physical Exam  Constitutional: He appears well-developed and well-nourished.  HENT:  Head: Normocephalic and atraumatic.  Eyes: Conjunctivae and EOM are normal. No scleral icterus.  Neck: Normal range of motion. Neck supple. No tracheal  deviation present.  Cardiovascular: Normal rate, regular rhythm and normal heart sounds.  Pulmonary/Chest: Effort normal.  No respiratory distress, but expiratory wheezes heard on auscultation as well as scattered rhonchi.  Patient's cough is harsh and wet. Abdominal: Soft. Bowel sounds are normal. There is no tenderness.  Neurological: He is alert and oriented to person, place, and time.  Gait normal  Skin: Skin is warm and dry. He is not diaphoretic. No pallor.  Psychiatric: He has a normal mood and affect. His behavior is normal. Thought content normal.  Nursing note and vitals reviewed.    Vitals:   07/01/18 0950  BP: (!) 152/86  Pulse: 96  Temp: 99.7 F (37.6 C)  SpO2: 99%       Assessment & Plan:   Influenza A, fever -  point-of-care flu testing is positive for influenza A in clinic.  Patient will take Tamiflu twice daily for 5 days.  Advised to keep up good fluid intake, get plenty of rest and do good handwashing.  Patient advised he is considered contagious for the next 5 to 7 days so he should remain home as much as possible.  Productive cough/wheezes/rhonchi - due to patient's harsh cough, and lung sounds we will also cover him with a azithromycin course to prevent respiratory infection.  He will use albuterol inhaler as needed to produce any shortness of breath or wheezing symptoms and Robitussin with codeine cough syrup prescribed to use if needed to reduce cough.  Patient aware that Robitussin with codeine can cause drowsiness, so he should not take this prior to driving.  Advised to keep regularly scheduled follow-up with PCP as planned.  Advised to return to clinic sooner if new issues arise or if current symptoms persist or worsen.

## 2018-07-01 NOTE — Patient Instructions (Signed)

## 2018-07-06 ENCOUNTER — Other Ambulatory Visit: Payer: Self-pay | Admitting: Family Medicine

## 2018-07-08 ENCOUNTER — Ambulatory Visit: Payer: 59 | Admitting: Adult Health

## 2018-07-08 ENCOUNTER — Encounter: Payer: Self-pay | Admitting: Adult Health

## 2018-07-08 VITALS — BP 140/88 | HR 73 | Temp 97.8°F | Resp 16 | Ht 69.0 in | Wt 176.0 lb

## 2018-07-08 DIAGNOSIS — R059 Cough, unspecified: Secondary | ICD-10-CM

## 2018-07-08 DIAGNOSIS — R05 Cough: Secondary | ICD-10-CM

## 2018-07-08 MED ORDER — BENZONATATE 200 MG PO CAPS: 200.0000 mg | ORAL_CAPSULE | Freq: Two times a day (BID) | ORAL | 0 refills | Status: DC | PRN

## 2018-07-08 MED ORDER — PREDNISONE 10 MG PO TABS: ORAL_TABLET | ORAL | 0 refills | Status: DC

## 2018-07-08 NOTE — Progress Notes (Signed)
Subjective:    Patient ID: Nathaniel George, male    DOB: Aug 18, 1955, 63 y.o.   MRN: [id number]  Was diagnosed with influenza on 07/01/2017. Was treated with Tamilfu and was given a z pack and albuteral inhaler for his cough at this time. Today in the office he reports that he feels as though the cough has improved slight, it is no longer productive but he feels wheezy at times and fatigued. Has history of COPD and continues to smoke   Cough  This is a new problem. The current episode started 1 to 4 weeks ago (2 weeks ). The problem has been gradually improving. The cough is non-productive. Associated symptoms include wheezing. Pertinent negatives include no chest pain, chills, fever, headaches, myalgias, postnasal drip or shortness of breath. The symptoms are aggravated by cold air, fumes and dust. He has tried nothing for the symptoms. His past medical history is significant for COPD.    Review of Systems  Constitutional: Positive for fatigue. Negative for chills and fever.  HENT: Negative for postnasal drip, sinus pressure and sinus pain.   Respiratory: Positive for cough and wheezing. Negative for chest tightness and shortness of breath.   Cardiovascular: Negative for chest pain.  Gastrointestinal: Negative.   Musculoskeletal: Negative for myalgias.  Neurological: Negative for headaches.  Psychiatric/Behavioral: Negative.    Past Medical History:  Diagnosis Date  . Chronic leg pain   . Nicotine dependence, cigarettes, uncomplicated     Social History   Socioeconomic History  . Marital status: Married    Spouse name: Not on file  . Number of children: Not on file  . Years of education: Not on file  . Highest education level: Not on file  Occupational History  . Not on file  Social Needs  . Financial resource strain: Not on file  . Food insecurity:    Worry: Not on file    Inability: Not on file  . Transportation needs:    Medical: Not on file    Non-medical: Not on file   Tobacco Use  . Smoking status: Current Every Day Smoker  . Smokeless tobacco: Never Used  Substance and Sexual Activity  . Alcohol use: Yes  . Drug use: Not Currently  . Sexual activity: Not on file  Lifestyle  . Physical activity:    Days per week: Not on file    Minutes per session: Not on file  . Stress: Not on file  Relationships  . Social connections:    Talks on phone: Not on file    Gets together: Not on file    Attends religious service: Not on file    Active member of club or organization: Not on file    Attends meetings of clubs or organizations: Not on file    Relationship status: Not on file  . Intimate partner violence:    Fear of current or ex partner: Not on file    Emotionally abused: Not on file    Physically abused: Not on file    Forced sexual activity: Not on file  Other Topics Concern  . Not on file  Social History Narrative  . Not on file    No past surgical history on file.  No family history on file.  No Known Allergies  Current Outpatient Medications on File Prior to Visit  Medication Sig Dispense Refill  . Albuterol Sulfate (PROAIR RESPICLICK) 108 (90 Base) MCG/ACT AEPB Inhale 2 puffs into the lungs every 4 (four) hours  as needed. 1 each 1  . azithromycin (ZITHROMAX) 250 MG tablet Take 2 tablets on day 1, take 1 tablet on days 2-5 6 tablet 0  . guaiFENesin-codeine 100-10 MG/5ML syrup Take 5 mLs by mouth every 6 (six) hours as needed for cough. 120 mL 0  . meloxicam (MOBIC) 15 MG tablet TAKE 1 TABLET(15 MG) BY MOUTH DAILY 30 tablet 0  . methylPREDNISolone (MEDROL DOSEPAK) 4 MG TBPK tablet Take according to pack instructions 21 tablet 0  . oseltamivir (TAMIFLU) 75 MG capsule Take 1 capsule (75 mg total) by mouth 2 (two) times daily. 10 capsule 0   No current facility-administered medications on file prior to visit.     BP 140/88 (BP Location: Left Arm, Patient Position: Sitting, Cuff Size: Small)   Pulse 73   Temp 97.8 F (36.6 C) (Oral)    Resp 16   Ht 5\' 9"  (1.753 m)   Wt 176 lb (79.8 kg)   SpO2 98%   BMI 25.99 kg/m       Objective:   Physical Exam Vitals signs and nursing note reviewed.  Constitutional:      Appearance: Normal appearance.  HENT:     Head: Normocephalic and atraumatic.     Right Ear: Tympanic membrane, ear canal and external ear normal. There is no impacted cerumen.     Left Ear: Tympanic membrane, ear canal and external ear normal. There is no impacted cerumen.     Nose: Nose normal. No congestion or rhinorrhea.     Mouth/Throat:     Mouth: Mucous membranes are moist.     Pharynx: Oropharynx is clear.  Eyes:     Extraocular Movements: Extraocular movements intact.     Pupils: Pupils are equal, round, and reactive to light.  Cardiovascular:     Rate and Rhythm: Normal rate and regular rhythm.     Pulses: Normal pulses.     Heart sounds: Normal heart sounds.  Pulmonary:     Effort: Pulmonary effort is normal. No respiratory distress.     Breath sounds: No stridor. Wheezing present. No rhonchi or rales.     Comments: Wheezing resolved with coughing   Chest:     Chest wall: No tenderness.  Musculoskeletal: Normal range of motion.  Skin:    General: Skin is warm and dry.     Capillary Refill: Capillary refill takes less than 2 seconds.  Neurological:     General: No focal deficit present.     Mental Status: He is alert and oriented to person, place, and time. Mental status is at baseline.  Psychiatric:        Mood and Affect: Mood normal.        Behavior: Behavior normal.        Thought Content: Thought content normal.        Judgment: Judgment normal.       Assessment & Plan:  1. Cough - He finished his abx therapy yesterday, will hold on placing on any additional abx at this time. Not concerned for pna currently. Will send in prednisone dose and tessalon pearls.  - [facility] if he develops a fever over the weekend. Follow up with PCP next week if his symptoms  have not resolved - Encouraged to quit smoking  - predniSONE (DELTASONE) 10 MG tablet; 40 mg x 3 days, 20 mg x 3 days, 10 mg x 3 days  Dispense: 21 tablet; Refill: 0 - benzonatate (TESSALON) 200  MG capsule; Take 1 capsule (200 mg total) by mouth 2 (two) times daily as needed for cough.  Dispense: 20 capsule; Refill: 0  Shirline Freesory Nikka Hakimian, NP

## 2019-01-16 ENCOUNTER — Telehealth: Payer: Self-pay | Admitting: Family Medicine

## 2019-01-16 ENCOUNTER — Ambulatory Visit (INDEPENDENT_AMBULATORY_CARE_PROVIDER_SITE_OTHER): Payer: 59 | Admitting: Family Medicine

## 2019-01-16 ENCOUNTER — Other Ambulatory Visit: Payer: Self-pay

## 2019-01-16 ENCOUNTER — Encounter: Payer: Self-pay | Admitting: Family Medicine

## 2019-01-16 VITALS — BP 134/84 | HR 86 | Temp 97.6°F | Ht 69.0 in | Wt 179.8 lb

## 2019-01-16 DIAGNOSIS — R5383 Other fatigue: Secondary | ICD-10-CM

## 2019-01-16 DIAGNOSIS — Z1159 Encounter for screening for other viral diseases: Secondary | ICD-10-CM | POA: Diagnosis not present

## 2019-01-16 DIAGNOSIS — Z0000 Encounter for general adult medical examination without abnormal findings: Secondary | ICD-10-CM | POA: Insufficient documentation

## 2019-01-16 DIAGNOSIS — Z1211 Encounter for screening for malignant neoplasm of colon: Secondary | ICD-10-CM

## 2019-01-16 DIAGNOSIS — Z23 Encounter for immunization: Secondary | ICD-10-CM

## 2019-01-16 DIAGNOSIS — Z13220 Encounter for screening for lipoid disorders: Secondary | ICD-10-CM

## 2019-01-16 DIAGNOSIS — Z125 Encounter for screening for malignant neoplasm of prostate: Secondary | ICD-10-CM | POA: Diagnosis not present

## 2019-01-16 LAB — CBC
HCT: 48.3 % (ref 39.0–52.0)
Hemoglobin: 15.8 g/dL (ref 13.0–17.0)
MCHC: 32.7 g/dL (ref 30.0–36.0)
MCV: 92.5 fl (ref 78.0–100.0)
Platelets: 180 10*3/uL (ref 150.0–400.0)
RBC: 5.22 Mil/uL (ref 4.22–5.81)
RDW: 13.3 % (ref 11.5–15.5)
WBC: 5.6 10*3/uL (ref 4.0–10.5)

## 2019-01-16 LAB — COMPREHENSIVE METABOLIC PANEL
ALT: 12 U/L (ref 0–53)
AST: 14 U/L (ref 0–37)
Albumin: 3.9 g/dL (ref 3.5–5.2)
Alkaline Phosphatase: 62 U/L (ref 39–117)
BUN: 6 mg/dL (ref 6–23)
CO2: 25 mEq/L (ref 19–32)
Calcium: 9.1 mg/dL (ref 8.4–10.5)
Chloride: 105 mEq/L (ref 96–112)
Creatinine, Ser: 0.72 mg/dL (ref 0.40–1.50)
GFR: 110.97 mL/min (ref >60.00)
Glucose, Bld: 102 mg/dL — ABNORMAL HIGH (ref 70–99)
Potassium: 3.8 mEq/L (ref 3.5–5.1)
Sodium: 136 mEq/L (ref 135–145)
Total Bilirubin: 0.4 mg/dL (ref 0.2–1.2)
Total Protein: 6.8 g/dL (ref 6.0–8.3)

## 2019-01-16 LAB — VITAMIN B12: Vitamin B-12: 554 pg/mL (ref 211–911)

## 2019-01-16 LAB — LIPID PANEL
Cholesterol: 138 mg/dL (ref 0–200)
HDL: 37.8 mg/dL — ABNORMAL LOW (ref >39.00)
LDL Cholesterol: 75 mg/dL (ref 0–99)
NonHDL: 99.93
Total CHOL/HDL Ratio: 4
Triglycerides: 126 mg/dL (ref 0.0–149.0)
VLDL: 25.2 mg/dL (ref 0.0–40.0)

## 2019-01-16 LAB — TSH: TSH: 1.92 u[IU]/mL (ref 0.35–4.50)

## 2019-01-16 LAB — TESTOSTERONE: Testosterone: 459.02 ng/dL (ref 300.00–890.00)

## 2019-01-16 LAB — PSA: PSA: 0.74 ng/mL (ref 0.10–4.00)

## 2019-01-16 NOTE — Assessment & Plan Note (Signed)
-  Well adult -Labs ordered for fatigue and cpe today.  Orders Placed This Encounter  Procedures  . Tdap vaccine greater than or equal to 61yo IM  . Comp Met (CMET)  . CBC  . Lipid panel  . TSH  . B12  . Testosterone  . Hepatitis C Antibody  . PSA  . Ambulatory referral to Gastroenterology    Referral Priority:   Routine    Referral Type:   Consultation    Referral Reason:   Specialty Services Required    Number of Visits Requested:   1  Screenings:  Colon cancer, lipid, hep c, psa.  Immunizations: tdap Anticipatory guidance/Risk factor reduction:  Recommended to quit smoking.  Discussed trying gum or lozenges, he will look into this.  Additional recommendations per AVS.

## 2019-01-16 NOTE — Progress Notes (Signed)
Nathaniel George - 64 y.o. male MRN [id number]  Date of birth: 1956-05-04  Subjective Chief Complaint  Patient presents with  . Annual Exam    CPE-fasting/ pt states been fatigue for 1 year/ wants Tdap    HPI Nathaniel George is a 63 y.o. male here today for annual exam.  He has complaint of fatigue today.  Reports fatigue for the past year.  Feels like it is related to working full time as well as caring for his wife who has been ill.  He denies feeling depressed or anxious.  He would like to have updated blood work today including testosterone checked.  He remains quite active doing tasks around his home.  He follows a fairly balanced diet.  He unfortunately continues to smoke but has cut from 1ppd to 1/2 ppd.  He has tried chantix previously but could not tolerate nausea from medication.  Has been unsuccessful in quitting with patches.  He is due for updated colon cancer screening.   Review of Systems  Constitutional: Positive for malaise/fatigue. Negative for chills, fever and weight loss.  HENT: Negative for congestion, ear pain and sore throat.   Eyes: Negative for blurred vision, double vision and pain.  Respiratory: Negative for cough and shortness of breath.   Cardiovascular: Negative for chest pain and palpitations.  Gastrointestinal: Negative for abdominal pain, blood in stool, constipation, heartburn and nausea.  Genitourinary: Negative for dysuria and urgency.  Musculoskeletal: Negative for joint pain and myalgias.  Neurological: Negative for dizziness and headaches.  Endo/Heme/Allergies: Does not bruise/bleed easily.  Psychiatric/Behavioral: Negative for depression. The patient is not nervous/anxious and does not have insomnia.      No Known Allergies  Past Medical History:  Diagnosis Date  . Chronic leg pain   . Nicotine dependence, cigarettes, uncomplicated     No past surgical history on file.  Social History   Socioeconomic History  . Marital status: Married     Spouse name: Not on file  . Number of children: Not on file  . Years of education: Not on file  . Highest education level: Not on file  Occupational History  . Not on file  Social Needs  . Financial resource strain: Not on file  . Food insecurity    Worry: Not on file    Inability: Not on file  . Transportation needs    Medical: Not on file    Non-medical: Not on file  Tobacco Use  . Smoking status: Current Every Day Smoker  . Smokeless tobacco: Never Used  Substance and Sexual Activity  . Alcohol use: Yes  . Drug use: Not Currently  . Sexual activity: Not on file  Lifestyle  . Physical activity    Days per week: Not on file    Minutes per session: Not on file  . Stress: Not on file  Relationships  . Social Herbalist on phone: Not on file    Gets together: Not on file    Attends religious service: Not on file    Active member of club or organization: Not on file    Attends meetings of clubs or organizations: Not on file    Relationship status: Not on file  Other Topics Concern  . Not on file  Social History Narrative  . Not on file    No family history on file.  Health Maintenance  Topic Date Due  . Hepatitis C Screening  February 21, 1956  . HIV Screening  01/02/1973  . TETANUS/TDAP  01/02/1977  . COLONOSCOPY  01/03/2008  . INFLUENZA VACCINE  01/06/2019    ----------------------------------------------------------------------------------------------------------------------------------------------------------------------------------------------------------------- Physical Exam BP 134/84   Pulse 86   Temp 97.6 F (36.4 C) (Oral)   Ht '5\' 9"'  (1.753 m)   Wt 179 lb 12.8 oz (81.6 kg)   SpO2 95%   BMI 26.55 kg/m   Physical Exam Constitutional:      General: He is not in acute distress. HENT:     Head: Normocephalic and atraumatic.     Right Ear: External ear normal.     Left Ear: External ear normal.     Mouth/Throat:     Mouth: Mucous membranes  are moist.  Eyes:     General: No scleral icterus. Neck:     Musculoskeletal: Normal range of motion and neck supple.     Thyroid: No thyromegaly.  Cardiovascular:     Rate and Rhythm: Normal rate and regular rhythm.     Heart sounds: Normal heart sounds.  Pulmonary:     Effort: Pulmonary effort is normal.     Breath sounds: Normal breath sounds.  Abdominal:     General: Bowel sounds are normal. There is no distension.     Palpations: Abdomen is soft.     Tenderness: There is no abdominal tenderness. There is no guarding.  Lymphadenopathy:     Cervical: No cervical adenopathy.  Skin:    General: Skin is warm and dry.     Findings: No rash.  Neurological:     Mental Status: He is alert and oriented to person, place, and time.     Cranial Nerves: No cranial nerve deficit.     Motor: No abnormal muscle tone.  Psychiatric:        Mood and Affect: Mood normal.        Behavior: Behavior normal.     ------------------------------------------------------------------------------------------------------------------------------------------------------------------------------------------------------------------- Assessment and Plan  Well adult exam -Well adult -Labs ordered for fatigue and cpe today.  Orders Placed This Encounter  Procedures  . Tdap vaccine greater than or equal to 7yo IM  . Comp Met (CMET)  . CBC  . Lipid panel  . TSH  . B12  . Testosterone  . Hepatitis C Antibody  . PSA  . Ambulatory referral to Gastroenterology    Referral Priority:   Routine    Referral Type:   Consultation    Referral Reason:   Specialty Services Required    Number of Visits Requested:   1  Screenings:  Colon cancer, lipid, hep c, psa.  Immunizations: tdap Anticipatory guidance/Risk factor reduction:  Recommended to quit smoking.  Discussed trying gum or lozenges, he will look into this.  Additional recommendations per AVS.

## 2019-01-16 NOTE — Telephone Encounter (Signed)
Please advise 

## 2019-01-16 NOTE — Telephone Encounter (Signed)
Pt forgot to ask if Dr. Zigmund Daniel during his appt if he could prescribe viagra or something similar. Pt would like a callback with what Dr. Zigmund Daniel decides. Please advise.

## 2019-01-16 NOTE — Patient Instructions (Signed)
Preventive Care 40-61 Years Old, Male Preventive care refers to lifestyle choices and visits with your health care provider that can promote health and wellness. This includes:  A yearly physical exam. This is also called an annual well check.  Regular dental and eye exams.  Immunizations.  Screening for certain conditions.  Healthy lifestyle choices, such as eating a healthy diet, getting regular exercise, not using drugs or products that contain nicotine and tobacco, and limiting alcohol use. What can I expect for my preventive care visit? Physical exam Your health care provider will check:  Height and weight. These may be used to calculate body mass index (BMI), which is a measurement that tells if you are at a healthy weight.  Heart rate and blood pressure.  Your skin for abnormal spots. Counseling Your health care provider may ask you questions about:  Alcohol, tobacco, and drug use.  Emotional well-being.  Home and relationship well-being.  Sexual activity.  Eating habits.  Work and work environment. What immunizations do I need?  Influenza (flu) vaccine  This is recommended every year. Tetanus, diphtheria, and pertussis (Tdap) vaccine  You may need a Td booster every 10 years. Varicella (chickenpox) vaccine  You may need this vaccine if you have not already been vaccinated. Zoster (shingles) vaccine  You may need this after age 60. Measles, mumps, and rubella (MMR) vaccine  You may need at least one dose of MMR if you were born in 1957 or later. You may also need a second dose. Pneumococcal conjugate (PCV13) vaccine  You may need this if you have certain conditions and were not previously vaccinated. Pneumococcal polysaccharide (PPSV23) vaccine  You may need one or two doses if you smoke cigarettes or if you have certain conditions. Meningococcal conjugate (MenACWY) vaccine  You may need this if you have certain conditions. Hepatitis A vaccine   You may need this if you have certain conditions or if you travel or work in places where you may be exposed to hepatitis A. Hepatitis B vaccine  You may need this if you have certain conditions or if you travel or work in places where you may be exposed to hepatitis B. Haemophilus influenzae type b (Hib) vaccine  You may need this if you have certain risk factors. Human papillomavirus (HPV) vaccine  If recommended by your health care provider, you may need three doses over 6 months. You may receive vaccines as individual doses or as more than one vaccine together in one shot (combination vaccines). Talk with your health care provider about the risks and benefits of combination vaccines. What tests do I need? Blood tests  Lipid and cholesterol levels. These may be checked every 5 years, or more frequently if you are over 50 years old.  Hepatitis C test.  Hepatitis B test. Screening  Lung cancer screening. You may have this screening every year starting at age 55 if you have a 30-pack-year history of smoking and currently smoke or have quit within the past 15 years.  Prostate cancer screening. Recommendations will vary depending on your family history and other risks.  Colorectal cancer screening. All adults should have this screening starting at age 50 and continuing until age 75. Your health care provider may recommend screening at age 45 if you are at increased risk. You will have tests every 1-10 years, depending on your results and the type of screening test.  Diabetes screening. This is done by checking your blood sugar (glucose) after you have not eaten   for a while (fasting). You may have this done every 1-3 years.  Sexually transmitted disease (STD) testing. Follow these instructions at home: Eating and drinking  Eat a diet that includes fresh fruits and vegetables, whole grains, lean protein, and low-fat dairy products.  Take vitamin and mineral supplements as recommended  by your health care provider.  Do not drink alcohol if your health care provider tells you not to drink.  If you drink alcohol: ? Limit how much you have to 0-2 drinks a day. ? Be aware of how much alcohol is in your drink. In the [place] one drink equals one 12 oz bottle of beer (355 mL), one 5 oz glass of wine (148 mL), or one 1 oz glass of hard liquor (44 mL). Lifestyle  Take daily care of your teeth and gums.  Stay active. Exercise for at least 30 minutes on 5 or more days each week.  Do not use any products that contain nicotine or tobacco, such as cigarettes, e-cigarettes, and chewing tobacco. If you need help quitting, ask your health care provider.  If you are sexually active, practice safe sex. Use a condom or other form of protection to prevent STIs (sexually transmitted infections).  Talk with your health care provider about taking a low-dose aspirin every day starting at age 33. What's next?  Go to your health care provider once a year for a well check visit.  Ask your health care provider how often you should have your eyes and teeth checked.  Stay up to date on all vaccines. This information is not intended to replace advice given to you by your health care provider. Make sure you discuss any questions you have with your health care provider. Document Released: 06/20/2015 Document Revised: 05/18/2018 Document Reviewed: 05/18/2018 Elsevier Patient Education  2020 [employer].

## 2019-01-17 NOTE — Telephone Encounter (Signed)
If he has never taken anything like this in the past we'll need to set up a VV or telephone visit to discuss and review potential side effects/precautions.

## 2019-01-17 NOTE — Telephone Encounter (Signed)
LVM for the pt to call back.

## 2019-01-18 ENCOUNTER — Other Ambulatory Visit: Payer: Self-pay | Admitting: Family Medicine

## 2019-01-18 LAB — HEPATITIS C ANTIBODY
Hepatitis C Ab: NONREACTIVE
SIGNAL TO CUT-OFF: 0.02 (ref <1.00)

## 2019-01-18 MED ORDER — SILDENAFIL CITRATE 100 MG PO TABS: 50.0000 mg | ORAL_TABLET | Freq: Every day | ORAL | 11 refills | Status: DC | PRN

## 2019-01-18 NOTE — Telephone Encounter (Signed)
Pt stated his previous PCP prescribed this rx to him about 6 mo ago but he never got it refill. Do you still want to see the pt first? Please advise.

## 2019-01-18 NOTE — Telephone Encounter (Signed)
No, ok if prescribed in the past. New rx sent in.

## 2019-01-18 NOTE — Telephone Encounter (Signed)
Pt is aware.  

## 2019-02-16 ENCOUNTER — Encounter: Payer: Self-pay | Admitting: Family Medicine

## 2019-03-19 ENCOUNTER — Ambulatory Visit (INDEPENDENT_AMBULATORY_CARE_PROVIDER_SITE_OTHER): Payer: 59

## 2019-03-19 ENCOUNTER — Encounter: Payer: Self-pay | Admitting: Family Medicine

## 2019-03-19 ENCOUNTER — Other Ambulatory Visit: Payer: Self-pay

## 2019-03-19 ENCOUNTER — Ambulatory Visit: Payer: 59 | Admitting: Family Medicine

## 2019-03-19 VITALS — BP 120/80 | HR 96 | Temp 98.1°F | Ht 69.0 in | Wt 177.6 lb

## 2019-03-19 DIAGNOSIS — M25512 Pain in left shoulder: Secondary | ICD-10-CM

## 2019-03-19 DIAGNOSIS — M25511 Pain in right shoulder: Secondary | ICD-10-CM

## 2019-03-19 DIAGNOSIS — Z23 Encounter for immunization: Secondary | ICD-10-CM

## 2019-03-19 NOTE — Progress Notes (Unsigned)
imm

## 2019-03-19 NOTE — Patient Instructions (Signed)
-  Try voltaren gel to shoulders.  This is available over the counter.  -We'll be in touch with xray results.

## 2019-03-19 NOTE — Assessment & Plan Note (Signed)
-  Xrays ordered -trial of meloxicam.  -Discussed if not improving with this I will enter referral to sports medicine.
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
## 2019-03-20 ENCOUNTER — Other Ambulatory Visit: Payer: Self-pay | Admitting: Family Medicine

## 2019-03-20 DIAGNOSIS — M25512 Pain in left shoulder: Secondary | ICD-10-CM

## 2019-03-20 DIAGNOSIS — G8929 Other chronic pain: Secondary | ICD-10-CM

## 2019-03-20 NOTE — Progress Notes (Signed)
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  I have entered a referral to orthopedics.

## 2019-04-03 ENCOUNTER — Other Ambulatory Visit: Payer: Self-pay | Admitting: Physician Assistant

## 2019-04-06 ENCOUNTER — Other Ambulatory Visit: Payer: Self-pay | Admitting: Physician Assistant

## 2019-04-06 DIAGNOSIS — M25511 Pain in right shoulder: Secondary | ICD-10-CM

## 2019-04-18 ENCOUNTER — Other Ambulatory Visit: Payer: Self-pay

## 2019-04-18 ENCOUNTER — Ambulatory Visit
Admission: RE | Admit: 2019-04-18 | Discharge: 2019-04-18 | Disposition: A | Discharge status: Home / Self Care | Payer: 59 | Source: Ambulatory Visit | Attending: Physician Assistant | Admitting: Physician Assistant

## 2019-04-18 DIAGNOSIS — M25511 Pain in right shoulder: Secondary | ICD-10-CM

## 2019-04-18 MED ORDER — IOPAMIDOL (ISOVUE-M 200) INJECTION 41%
15.0000 mL | Freq: Once | INTRAMUSCULAR | Status: AC
  Administered 2019-04-18: 15 mL via INTRA_ARTICULAR

## 2019-09-01 ENCOUNTER — Ambulatory Visit: Payer: 59

## 2019-09-03 ENCOUNTER — Ambulatory Visit: Payer: 59 | Attending: Internal Medicine

## 2019-09-03 DIAGNOSIS — Z23 Encounter for immunization: Secondary | ICD-10-CM

## 2019-09-03 NOTE — Progress Notes (Signed)
   Covid-19 Vaccination Clinic  Name:  HAYDEN KIHARA    MRN: [id number] DOB: May 13, 1956  09/03/2019  Mr. Ogata was observed post Covid-19 immunization for 15 minutes without incident. He was provided with Vaccine Information Sheet and instruction to access the V-Safe system.   Mr. Jost was instructed to call 911 with any severe reactions post vaccine: Marland Kitchen Difficulty breathing  . Swelling of face and throat  . A fast heartbeat  . A bad rash all over body  . Dizziness and weakness   Immunizations Administered    Name Date Dose VIS Date Route   Pfizer COVID-19 Vaccine 09/03/2019  4:14 PM 0.3 mL 05/18/2019 Intramuscular   Manufacturer: [employer], [employer]   Lot: [id number]   NDC: [address]-4

## 2019-09-26 ENCOUNTER — Ambulatory Visit: Payer: 59 | Attending: Internal Medicine

## 2019-09-26 DIAGNOSIS — Z23 Encounter for immunization: Secondary | ICD-10-CM

## 2019-09-26 NOTE — Progress Notes (Signed)
   Covid-19 Vaccination Clinic  Name:  CRISTON CHANCELLOR    MRN: [id number] DOB: [birth year]  09/26/2019  Mr. Novello was observed post Covid-19 immunization for 15 minutes without incident. He was provided with Vaccine Information Sheet and instruction to access the V-Safe system.   Mr. Diehl was instructed to call 911 with any severe reactions post vaccine: Marland Kitchen Difficulty breathing  . Swelling of face and throat  . A fast heartbeat  . A bad rash all over body  . Dizziness and weakness   Immunizations Administered    Name Date Dose VIS Date Route   Pfizer COVID-19 Vaccine 09/26/2019  4:23 PM 0.3 mL 08/01/2018 Intramuscular   Manufacturer: [employer], [employer]   Lot: [id number]   NDC: [address]-2

## 2019-11-16 ENCOUNTER — Other Ambulatory Visit: Payer: Self-pay | Admitting: Family Medicine

## 2020-06-12 ENCOUNTER — Other Ambulatory Visit: Payer: Self-pay | Admitting: Family Medicine

## 2020-06-12 NOTE — Telephone Encounter (Signed)
Last fill 11/20/19  #30/0 Next OV 07/11/20

## 2020-06-13 ENCOUNTER — Telehealth: Payer: Self-pay

## 2020-06-13 ENCOUNTER — Encounter: Payer: Self-pay | Admitting: Nurse Practitioner

## 2020-06-13 ENCOUNTER — Telehealth (INDEPENDENT_AMBULATORY_CARE_PROVIDER_SITE_OTHER): Payer: 59 | Admitting: Nurse Practitioner

## 2020-06-13 VITALS — Ht 69.0 in | Wt 200.0 lb

## 2020-06-13 DIAGNOSIS — Z720 Tobacco use: Secondary | ICD-10-CM

## 2020-06-13 DIAGNOSIS — J209 Acute bronchitis, unspecified: Secondary | ICD-10-CM

## 2020-06-13 MED ORDER — HYDROCODONE-HOMATROPINE 5-1.5 MG/5ML PO SYRP: 5.0000 mL | ORAL_SOLUTION | Freq: Four times a day (QID) | ORAL | 0 refills | Status: DC | PRN

## 2020-06-13 MED ORDER — ALBUTEROL SULFATE HFA 108 (90 BASE) MCG/ACT IN AERS: 1.0000 | INHALATION_SPRAY | Freq: Four times a day (QID) | RESPIRATORY_TRACT | 0 refills | Status: DC | PRN

## 2020-06-13 MED ORDER — PREDNISONE 20 MG PO TABS: 40.0000 mg | ORAL_TABLET | Freq: Every day | ORAL | 0 refills | Status: DC

## 2020-06-13 NOTE — Patient Instructions (Signed)
Alternate between robitussin and hycodan every 6hrs as needed. Use albuterol 2puffs every 8hrs x 3days, then as needed Call office if no improvement in 3days.  Acute Bronchitis, Adult  Acute bronchitis is sudden or acute swelling of the air tubes (bronchi) in the lungs. Acute bronchitis causes these tubes to fill with mucus, which can make it hard to breathe. It can also cause coughing or wheezing. In adults, acute bronchitis usually goes away within 2 weeks. A cough caused by bronchitis may last up to 3 weeks. Smoking, allergies, and asthma can make the condition worse. What are the causes? This condition can be caused by germs and by substances that irritate the lungs, including:  Cold and flu viruses. The most common cause of this condition is the virus that causes the common cold.  Bacteria.  Substances that irritate the lungs, including: ? Smoke from cigarettes and other forms of tobacco. ? Dust and pollen. ? Fumes from chemical products, gases, or burned fuel. ? Other materials that pollute indoor or outdoor air.  Close contact with someone who has acute bronchitis. What increases the risk? The following factors may make you more likely to develop this condition:  A weak body's defense system, also called the immune system.  A condition that affects your lungs and breathing, such as asthma. What are the signs or symptoms? Common symptoms of this condition include:  Lung and breathing problems, such as: ? Coughing. This may bring up clear, yellow, or green mucus from your lungs (sputum). ? Wheezing. ? Having too much mucus in your lungs (chest congestion). ? Having shortness of breath.  A fever.  Chills.  Aches and pains, including: ? Tightness in your chest and other body aches. ? A sore throat. How is this diagnosed? This condition is usually diagnosed based on:  Your symptoms and medical history.  A physical exam. You may also have other tests, including  tests to rule out other conditions, such pneumonia. These tests include:  A test of lung function.  Test of a mucus sample to look for the presence of bacteria.  Tests to check the oxygen level in your blood.  Blood tests.  Chest X-ray. How is this treated? Most cases of acute bronchitis clear up over time without treatment. Your health care provider may recommend:  Drinking more fluids. This can thin your mucus, which may improve your breathing.  Taking a medicine for a fever or cough.  Using a device that gets medicine into your lungs (inhaler) to help improve breathing and control coughing.  Using a vaporizer or a humidifier. These are machines that add water to the air to help you breathe better. Follow these instructions at home: Activity  Get plenty of rest.  Return to your normal activities as told by your health care provider. Ask your health care provider what activities are safe for you. Lifestyle  Drink enough fluid to keep your urine pale yellow.  Do not drink alcohol.  Do not use any products that contain nicotine or tobacco, such as cigarettes, e-cigarettes, and chewing tobacco. If you need help quitting, ask your health care provider. Be aware that: ? Your bronchitis will get worse if you smoke or breathe in other people's smoke (secondhand smoke). ? Your lungs will heal faster if you quit smoking. General instructions   Take over-the-counter and prescription medicines only as told by your health care provider.  Use an inhaler, vaporizer, or humidifier as told by your health care provider.  If  you have a sore throat, gargle with a salt-water mixture 3-4 times a day or as needed. To make a salt-water mixture, completely dissolve -1 tsp (3-6 g) of salt in 1 cup (237 mL) of warm water.  Keep all follow-up visits as told by your health care provider. This is important. How is this prevented? To lower your risk of getting this condition again:  Wash your  hands often with soap and water. If soap and water are not available, use hand sanitizer.  Avoid contact with people who have cold symptoms.  Try not to touch your mouth, nose, or eyes with your hands.  Avoid places where there are fumes from chemicals. Breathing these fumes will make your condition worse.  Get the flu shot every year. Contact a health care provider if:  Your symptoms do not improve after 2 weeks of treatment.  You vomit more than once or twice.  You have symptoms of dehydration such as: ? Dark urine. ? Dry skin or eyes. ? Increased thirst. ? Headaches. ? Confusion. ? Muscle cramps. Get help right away if you:  Cough up blood.  Feel pain in your chest.  Have severe shortness of breath.  Faint or keep feeling like you are going to faint.  Have a severe headache.  Have fever or chills that get worse. These symptoms may represent a serious problem that is an emergency. Do not wait to see if the symptoms will go away. Get medical help right away. Call your local emergency services (911 in the [place] Do not drive yourself to the hospital. Summary  Acute bronchitis is sudden (acute) inflammation of the air tubes (bronchi) between the windpipe and the lungs. In adults, acute bronchitis usually goes away within 2 weeks, although coughing may last 3 weeks or longer  Take over-the-counter and prescription medicines only as told by your health care provider.  Drink enough fluid to keep your urine pale yellow.  Contact a health care provider if your symptoms do not improve after 2 weeks of treatment.  Get help right away if you cough up blood, faint, or have chest pain or shortness of breath. This information is not intended to replace advice given to you by your health care provider. Make sure you discuss any questions you have with your health care provider. Document Revised: 02/05/2019 Document Reviewed: 12/15/2018 Elsevier Patient Education  2020 [employer].

## 2020-06-13 NOTE — Telephone Encounter (Signed)
Patient calling c/o a dry cough x 2days.  Pt would like some cough syrup called into his pharmacy.  Pt is wanting to know if he can be seen today.  There is a 10am spot on Charlotte's schedule today.  Also he is new patient of Dr. Veto Kemps.  Please advise.  FT#[phone]

## 2020-06-13 NOTE — Progress Notes (Signed)
Virtual Visit via Telephone Note  I connected with Salomon Fick on 06/13/20 at 10:00 AM EST by telephone and verified that I am speaking with the correct person using two identifiers.  Location: Patient:home Provider: office Participants: patient and provider  I discussed the limitations, risks, security and privacy concerns of performing an evaluation and management service by telephone and the availability of in person appointments. I also discussed with the patient that there may be a patient responsible charge related to this service. The patient expressed understanding and agreed to proceed.  CC: cough x 2days  History of Present Illness: Cough This is a new problem. Episode onset: 2days ago. The problem has been unchanged. The problem occurs constantly. The cough is productive of sputum. Associated symptoms include wheezing. Pertinent negatives include no chest pain, chills, ear congestion, ear pain, fever, headaches, heartburn, hemoptysis, myalgias, nasal congestion, postnasal drip, rash, rhinorrhea, sore throat, shortness of breath, sweats or weight loss. The symptoms are aggravated by lying down. Risk factors for lung disease include smoking/tobacco exposure. He has tried nothing for the symptoms. His past medical history is significant for bronchitis.  tobacco use 1ppd.  Observations/Objective: Alert and oriented x4 Normal speech  Assessment and Plan: Makenzie was seen today for acute visit.  Diagnoses and all orders for this visit:  Acute bronchitis, unspecified organism -     albuterol (VENTOLIN HFA) 108 (90 Base) MCG/ACT inhaler; Inhale 1-2 puffs into the lungs every 6 (six) hours as needed for shortness of breath. -     HYDROcodone-homatropine (HYCODAN) 5-1.5 MG/5ML syrup; Take 5 mLs by mouth every 6 (six) hours as needed. -     predniSONE (DELTASONE) 20 MG tablet; Take 2 tablets (40 mg total) by mouth daily with breakfast.  Tobacco use -     albuterol (VENTOLIN HFA)  108 (90 Base) MCG/ACT inhaler; Inhale 1-2 puffs into the lungs every 6 (six) hours as needed for shortness of breath. -     HYDROcodone-homatropine (HYCODAN) 5-1.5 MG/5ML syrup; Take 5 mLs by mouth every 6 (six) hours as needed. -     predniSONE (DELTASONE) 20 MG tablet; Take 2 tablets (40 mg total) by mouth daily with breakfast.   Follow Up Instructions: See above   I discussed the assessment and treatment plan with the patient. The patient was provided an opportunity to ask questions and all were answered. The patient agreed with the plan and demonstrated an understanding of the instructions.   The patient was advised to call back or seek an in-person evaluation if the symptoms worsen or if the condition fails to improve as anticipated.  I provided of non-face-to-face time during this encounter.  Alysia Penna, NP

## 2020-06-13 NOTE — Telephone Encounter (Signed)
Virtual visit at 10am

## 2020-06-13 NOTE — Telephone Encounter (Signed)
Spoke to pt again. Ok per [place] - video visit scheduled.

## 2020-06-20 ENCOUNTER — Telehealth: Payer: Self-pay | Admitting: Nurse Practitioner

## 2020-06-20 DIAGNOSIS — J209 Acute bronchitis, unspecified: Secondary | ICD-10-CM

## 2020-06-20 DIAGNOSIS — Z720 Tobacco use: Secondary | ICD-10-CM

## 2020-06-20 MED ORDER — DOXYCYCLINE HYCLATE 100 MG PO TABS: 100.0000 mg | ORAL_TABLET | Freq: Two times a day (BID) | ORAL | 0 refills | Status: AC

## 2020-06-20 MED ORDER — BENZONATATE 100 MG PO CAPS: 100.0000 mg | ORAL_CAPSULE | Freq: Three times a day (TID) | ORAL | 0 refills | Status: DC | PRN

## 2020-06-20 NOTE — Telephone Encounter (Signed)
Pt calling and said that he had video appt with Nathaniel George last week and said that he is feeling a little better but that his coughing is worse at night, He wanted to know if he could have some more cough medicine and prednisone could be sent in. Please advise

## 2020-06-24 NOTE — Telephone Encounter (Signed)
Patient notified and verbalized understanding Pt states he is better now, states he did not get a COVID test because he feels fine and his wife was tested and hers was negative so he does not want to go get tested.

## 2020-07-10 ENCOUNTER — Other Ambulatory Visit: Payer: Self-pay

## 2020-07-11 ENCOUNTER — Ambulatory Visit (INDEPENDENT_AMBULATORY_CARE_PROVIDER_SITE_OTHER): Payer: 59 | Admitting: Family Medicine

## 2020-07-11 ENCOUNTER — Encounter: Payer: Self-pay | Admitting: Family Medicine

## 2020-07-11 VITALS — BP 144/88 | HR 89 | Temp 98.1°F | Ht 69.0 in | Wt 189.2 lb

## 2020-07-11 DIAGNOSIS — M1990 Unspecified osteoarthritis, unspecified site: Secondary | ICD-10-CM

## 2020-07-11 DIAGNOSIS — F17209 Nicotine dependence, unspecified, with unspecified nicotine-induced disorders: Secondary | ICD-10-CM | POA: Diagnosis not present

## 2020-07-11 DIAGNOSIS — Z23 Encounter for immunization: Secondary | ICD-10-CM | POA: Diagnosis not present

## 2020-07-11 MED ORDER — NICOTINE 21 MG/24HR TD PT24: 21.0000 mg | MEDICATED_PATCH | TRANSDERMAL | 0 refills | Status: DC

## 2020-07-11 NOTE — Patient Instructions (Signed)
Tobacco Use Disorder Tobacco use disorder (TUD) occurs when a person craves, seeks, and uses tobacco, regardless of the consequences. This disorder can cause problems with mental and physical health. It can affect your ability to have healthy relationships, and it can keep you from meeting your responsibilities at work, home, or school. Tobacco may be:  Smoked as a cigarette or cigar.  Inhaled using e-cigarettes.  Smoked in a pipe or hookah.  Chewed as smokeless tobacco.  Inhaled into the nostrils as snuff. Tobacco products contain a dangerous chemical called nicotine, which is very addictive. Nicotine triggers hormones that make the body feel stimulated and works on areas of the brain that make you feel good. These effects can make it hard for people to quit nicotine. Tobacco contains many other unsafe chemicals that can damage almost every organ in the body. Smoking tobacco also puts others in danger due to fire risk and possible health problems caused by breathing in secondhand smoke. What are the signs or symptoms? Symptoms of TUD may include:  Being unable to slow down or stop your tobacco use.  Spending an abnormal amount of time getting or using tobacco.  Craving tobacco. Cravings may last for up to 6 months after quitting.  Tobacco use that: ? Interferes with your work, school, or home life. ? Interferes with your personal and social relationships. ? Makes you give up activities that you once enjoyed or found important.  Using tobacco even though you know that it is: ? Dangerous or bad for your health or someone else's health. ? Causing problems in your life.  Needing more and more of the substance to get the same effect (developing tolerance).  Experiencing unpleasant symptoms if you do not use the substance (withdrawal). Withdrawal symptoms may include: ? Depressed, anxious, or irritable mood. ? Difficulty concentrating. ? Increased appetite. ? Restlessness or trouble  sleeping.  Using the substance to avoid withdrawal. How is this diagnosed? This condition may be diagnosed based on:  Your current and past tobacco use. Your health care provider may ask questions about how your tobacco use affects your life.  A physical exam. You may be diagnosed with TUD if you have at least two symptoms within a 12-month period. How is this treated? This condition is treated by stopping tobacco use. Many people are unable to quit on their own and need help. Treatment may include:  Nicotine replacement therapy (NRT). NRT provides nicotine without the other harmful chemicals in tobacco. NRT gradually lowers the dosage of nicotine in the body and reduces withdrawal symptoms. NRT is available as: ? Over-the-counter gums, lozenges, and skin patches. ? Prescription mouth inhalers and nasal sprays.  Medicine that acts on the brain to reduce cravings and withdrawal symptoms.  A type of talk therapy that examines your triggers for tobacco use, how to avoid them, and how to cope with cravings (behavioral therapy).  Hypnosis. This may help with withdrawal symptoms.  Joining a support group for others coping with TUD. The best treatment for TUD is usually a combination of medicine, talk therapy, and support groups. Recovery can be a long process. Many people start using tobacco again after stopping (relapse). If you relapse, it does not mean that treatment will not work. Follow these instructions at home: Lifestyle  Do not use any products that contain nicotine or tobacco, such as cigarettes and e-cigarettes.  Avoid things that trigger tobacco use as much as you can. Triggers include people and situations that usually cause you to   use tobacco.  Avoid drinks that contain caffeine, including coffee. These may worsen some withdrawal symptoms.  Find ways to manage stress. Wanting to smoke may cause stress, and stress can make you want to smoke. Relaxation techniques such as deep  breathing, meditation, and yoga may help.  Attend support groups as needed. These groups are an important part of long-term recovery for many people. General instructions  Take over-the-counter and prescription medicines only as told by your health care provider.  Check with your health care provider before taking any new prescription or over-the-counter medicines.  Decide on a friend, family member, or smoking quit-line (such as 1-800-QUIT-NOW in the [place] that you can call or text when you feel the urge to smoke or when you need help coping with cravings.  Keep all follow-up visits as told by your health care provider and therapist. This is important.   Contact a health care provider if:  You are not able to take your medicines as prescribed.  Your symptoms get worse, even with treatment. Summary  Tobacco use disorder (TUD) occurs when a person craves, seeks, and uses tobacco regardless of the consequences.  This condition may be diagnosed based on your current and past tobacco use and a physical exam.  Many people are unable to quit on their own and need help. Recovery can be a long process.  The most effective treatment for TUD is usually a combination of medicine, talk therapy, and support groups. This information is not intended to replace advice given to you by your health care provider. Make sure you discuss any questions you have with your health care provider. Document Revised: 05/11/2017 Document Reviewed: 05/11/2017 Elsevier Patient Education  2021 Elsevier Inc.  

## 2020-07-11 NOTE — Progress Notes (Addendum)
[facility] [address] [place] [address] Dept: [phone] Dept Fax: [phone]  [facility] Office Visit  Subjective:    Patient ID: Nathaniel George, male    DOB: 1955/08/03, 65 y.o..   MRN: [id number]  Chief Complaint  Patient presents with  . Transitions Of Care    TOC from Dr. Ashley George, no concerns.     History of Present Illness:  Patient is in today for to establish care with me. He is doing well overall, though he notes occasional arthritic complaints in his shoulders and knees. He uses Mobic episodically when this flares up. 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  Nathaniel George is a smoker, noting about 1 ppd. He has tried to quit in the past, but relapsed. He asks if there is something other than Chantix that he might try. He notes he usually smokes his first cigarette of the day within 30 min of getting up. He does not have trouble sitting through church service without smoking. He feels that most of his issue is getting his head straight about committing to stopping.  Past Medical History: Patient Active Problem List   Diagnosis Date Noted  . Well adult exam 01/16/2019  . Left shoulder pain 02/13/2018  . Rash 12/02/2017  . Upper respiratory tract infection 12/02/2017  . Cigarette nicotine dependence without complication 10/28/2017  . Bilateral elbow joint pain 10/28/2017   Past Surgical History:  Procedure Laterality Date  . CARPAL TUNNEL RELEASE Right 2020   No family history on file.  Outpatient Medications Prior to Visit  Medication Sig Dispense Refill  . albuterol (VENTOLIN HFA) 108 (90 Base) MCG/ACT inhaler Inhale 1-2 puffs into the lungs every 6 (six) hours as needed for shortness of breath. 6.7 g 0  . meloxicam (MOBIC) 15 MG tablet TAKE 1 TABLET BY MOUTH EVERY DAY 30 tablet 0  . benzonatate (TESSALON) 100 MG capsule Take 1-2 capsules (100-200 mg total) by mouth 3 (three) times daily as  needed. (Patient not taking: Reported on 07/11/2020) 20 capsule 0  . sildenafil (VIAGRA) 100 MG tablet Take 0.5-1 tablets (50-100 mg total) by mouth daily as needed for erectile dysfunction. (Patient not taking: Reported on 07/11/2020) 5 tablet 11   No facility-administered medications prior to visit.     Objective:   Today's Vitals   07/11/20 1535  BP: (!) 144/88  Pulse: 89  Temp: 98.1 F (36.7 C)  TempSrc: Temporal  SpO2: 98%  Weight: 189 lb 3.2 oz (85.8 kg)  Height: 5\' 9"  (1.753 m)   Body mass index is 27.94 kg/m.   General: Well developed, well nourished. No acute distress. Lungs: Clear to auscultation bilaterally. CV: RRR without murmurs or rubs. Pulses 2+ bilaterally. Psych: Alert and oriented. Normal mood and affect.  Health Maintenance Due  Topic Date Due  . HIV Screening  Never done  . COLONOSCOPY (Pts 45-11yrs Insurance coverage will need to be confirmed)  Never done      Assessment & Plan:   1. Tobacco use disorder, continuous We discussed the potential long-term benefits of smoking cessation. Nathaniel George would like try nicotine patches. Reassess in 3-6 months.  - nicotine (NICODERM CQ) 21 mg/24hr patch; Place 1 patch (21 mg total) onto the skin daily.  Dispense: 28 patch; Refill: 0  2. Need for influenza vaccination  - Flu Vaccine QUAD 6+ mos PF IM (Fluarix Quad PF)  3. Arthritis Stable with periodic use of Mobic.  [place]  Gena Fray, MD

## 2021-01-09 ENCOUNTER — Ambulatory Visit: Payer: 59 | Admitting: Family Medicine

## 2021-10-12 ENCOUNTER — Other Ambulatory Visit: Payer: Self-pay

## 2021-10-12 ENCOUNTER — Encounter (HOSPITAL_BASED_OUTPATIENT_CLINIC_OR_DEPARTMENT_OTHER): Payer: Self-pay

## 2021-10-12 DIAGNOSIS — L509 Urticaria, unspecified: Secondary | ICD-10-CM | POA: Insufficient documentation

## 2021-10-12 DIAGNOSIS — R21 Rash and other nonspecific skin eruption: Secondary | ICD-10-CM | POA: Diagnosis present

## 2021-10-12 NOTE — ED Triage Notes (Signed)
Patient here POV from Home. ? ?Endorses coming into contact on Saturday with [place] and sustaining a Rash generalized to Body. Mainly located to Torso, back and Bilateral Legs. ? ?No Fevers. No Oral/Airway Occlusion. Attempted Lotions, Bleach Baths, and Cortisone Cream. ? ?NAD noted during Triage. A&Ox4. GCS 15. Ambulatory. ?

## 2021-10-13 ENCOUNTER — Emergency Department (HOSPITAL_BASED_OUTPATIENT_CLINIC_OR_DEPARTMENT_OTHER)
Admission: EM | Admit: 2021-10-13 | Discharge: 2021-10-13 | Disposition: A | Discharge status: Home / Self Care | Payer: 59 | Attending: Emergency Medicine | Admitting: Emergency Medicine

## 2021-10-13 DIAGNOSIS — L509 Urticaria, unspecified: Secondary | ICD-10-CM

## 2021-10-13 MED ORDER — PREDNISONE 10 MG (21) PO TBPK: ORAL_TABLET | ORAL | 0 refills | Status: DC

## 2021-10-13 MED ORDER — PREDNISONE 50 MG PO TABS
60.0000 mg | ORAL_TABLET | Freq: Once | ORAL | Status: AC
  Administered 2021-10-13: 60 mg via ORAL
  Filled 2021-10-13: qty 1

## 2021-10-13 NOTE — ED Provider Notes (Signed)
? ?[facility] EMERGENCY DEPT  ?Provider Note ? ?CSN: [id number] ?Arrival date & time: 10/12/21 2200 ? ?History ?Chief Complaint  ?Patient presents with  ? Rash  ? ? ?Nathaniel George is a 66 y.o. male has had an itchy rash to his arms, legs and torso for 2 days, he thinks he was exposed to poison sumac. He has used OTC creams with some improvement in the itching, but still has the rash. No trouble breathing ? ? ?Home Medications ?Prior to Admission medications   ?Medication Sig Start Date End Date Taking? Authorizing Provider  ?predniSONE (STERAPRED UNI-PAK 21 TAB) 10 MG (21) TBPK tablet 10mg  Tabs, 6 day taper. Use as directed 10/13/21  Yes 12/13/21, MD  ?albuterol (VENTOLIN HFA) 108 (90 Base) MCG/ACT inhaler Inhale 1-2 puffs into the lungs every 6 (six) hours as needed for shortness of breath. 06/13/20   [place], 08/11/20, NP  ?meloxicam (MOBIC) 15 MG tablet TAKE 1 TABLET BY MOUTH EVERY DAY 06/12/20   08/10/20 B, FNP  ?nicotine (NICODERM CQ) 21 mg/24hr patch Place 1 patch (21 mg total) onto the skin daily. 07/11/20   09/08/20, MD  ? ? ? ?Allergies    ?Patient has no known allergies. ? ? ?Review of Systems   ?Review of Systems ?Please see HPI for pertinent positives and negatives ? ?Physical Exam ?BP (!) 133/101 (BP Location: Right Arm)   Pulse 96   Temp 98 ?F (36.7 ?C) (Oral)   Resp 18   Ht 5\' 9"  (1.753 m)   Wt 85.8 kg   SpO2 100%   BMI 27.93 kg/m?  ? ?Physical Exam ?Vitals and nursing note reviewed.  ?Constitutional:   ?   Appearance: Normal appearance.  ?HENT:  ?   Head: Normocephalic and atraumatic.  ?   Nose: Nose normal.  ?   Mouth/Throat:  ?   Mouth: Mucous membranes are moist.  ?Eyes:  ?   Extraocular Movements: Extraocular movements intact.  ?   Conjunctiva/sclera: Conjunctivae normal.  ?Cardiovascular:  ?   Rate and Rhythm: Normal rate.  ?Pulmonary:  ?   Effort: Pulmonary effort is normal.  ?   Breath sounds: Normal breath sounds.  ?Abdominal:  ?   General: Abdomen is  flat.  ?   Palpations: Abdomen is soft.  ?   Tenderness: There is no abdominal tenderness.  ?Musculoskeletal:     ?   General: No swelling. Normal range of motion.  ?   Cervical back: Neck supple.  ?Skin: ?   General: Skin is warm and dry.  ?   Findings: Rash (Urticarial rash to arms, legs and torso) present.  ?Neurological:  ?   General: No focal deficit present.  ?   Mental Status: He is alert and oriented to person, place, and time.  ?Psychiatric:     ?   Mood and Affect: Mood normal.  ? ? ?ED Results / Procedures / Treatments   ?EKG ?None ? ?Procedures ?Procedures ? ?Medications Ordered in the ED ?Medications  ?predniSONE (DELTASONE) tablet 60 mg (has no administration in time range)  ? ? ?Initial Impression and Plan ? Patient with urticarial rash, unclear if this is truly a contact dermatitis or some other etiology but will treat with oral steroids given the extent. Oral benadryl as needed fr itching. PCP follow up.  ? ?ED Course  ? ?  ? ? ?MDM Rules/Calculators/A&P ?Medical Decision Making ?Problems Addressed: ?Hives: acute illness or injury ? ?Risk ?OTC drugs. ?  Prescription drug management. ? ? ? ?Final Clinical Impression(s) / ED Diagnoses ?Final diagnoses:  ?Hives  ? ? ?Rx / DC Orders ?ED Discharge Orders   ? ?      Ordered  ?  predniSONE (STERAPRED UNI-PAK 21 TAB) 10 MG (21) TBPK tablet       ? 10/13/21 0053  ? ?  ?  ? ?  ? ?  ?Pollyann Savoy, MD ?10/13/21 [phone] ? ?

## 2021-11-05 ENCOUNTER — Ambulatory Visit (INDEPENDENT_AMBULATORY_CARE_PROVIDER_SITE_OTHER): Payer: 59

## 2021-11-05 ENCOUNTER — Telehealth: Payer: Self-pay | Admitting: Nurse Practitioner

## 2021-11-05 ENCOUNTER — Encounter: Payer: Self-pay | Admitting: Nurse Practitioner

## 2021-11-05 ENCOUNTER — Ambulatory Visit: Payer: 59 | Admitting: Nurse Practitioner

## 2021-11-05 VITALS — BP 130/70 | HR 43 | Temp 98.0°F | Ht 69.0 in | Wt 159.0 lb

## 2021-11-05 DIAGNOSIS — S30860A Insect bite (nonvenomous) of lower back and pelvis, initial encounter: Secondary | ICD-10-CM

## 2021-11-05 DIAGNOSIS — R0602 Shortness of breath: Secondary | ICD-10-CM | POA: Diagnosis not present

## 2021-11-05 DIAGNOSIS — F1021 Alcohol dependence, in remission: Secondary | ICD-10-CM | POA: Insufficient documentation

## 2021-11-05 DIAGNOSIS — F10230 Alcohol dependence with withdrawal, uncomplicated: Secondary | ICD-10-CM

## 2021-11-05 DIAGNOSIS — F17209 Nicotine dependence, unspecified, with unspecified nicotine-induced disorders: Secondary | ICD-10-CM | POA: Diagnosis not present

## 2021-11-05 DIAGNOSIS — W57XXXA Bitten or stung by nonvenomous insect and other nonvenomous arthropods, initial encounter: Secondary | ICD-10-CM

## 2021-11-05 DIAGNOSIS — R634 Abnormal weight loss: Secondary | ICD-10-CM

## 2021-11-05 DIAGNOSIS — J209 Acute bronchitis, unspecified: Secondary | ICD-10-CM

## 2021-11-05 LAB — CBC WITH DIFFERENTIAL/PLATELET
Basophils Absolute: 0 10*3/uL (ref 0.0–0.1)
Basophils Relative: 1.3 % (ref 0.0–3.0)
Eosinophils Absolute: 0 10*3/uL (ref 0.0–0.7)
Eosinophils Relative: 0.1 % (ref 0.0–5.0)
HCT: 41.7 % (ref 39.0–52.0)
Hemoglobin: 14 g/dL (ref 13.0–17.0)
Lymphocytes Relative: 15.8 % (ref 12.0–46.0)
Lymphs Abs: 0.4 10*3/uL — ABNORMAL LOW (ref 0.7–4.0)
MCHC: 33.6 g/dL (ref 30.0–36.0)
MCV: 91.5 fl (ref 78.0–100.0)
Monocytes Absolute: 0.2 10*3/uL (ref 0.1–1.0)
Monocytes Relative: 8.2 % (ref 3.0–12.0)
Neutro Abs: 1.8 10*3/uL (ref 1.4–7.7)
Neutrophils Relative %: 74.6 % (ref 43.0–77.0)
Platelets: 63 10*3/uL — ABNORMAL LOW (ref 150.0–400.0)
RBC: 4.56 Mil/uL (ref 4.22–5.81)
RDW: 12.9 % (ref 11.5–15.5)
WBC: 2.4 10*3/uL — ABNORMAL LOW (ref 4.0–10.5)

## 2021-11-05 LAB — COMPREHENSIVE METABOLIC PANEL
ALT: 61 U/L — ABNORMAL HIGH (ref 0–53)
AST: 173 U/L — ABNORMAL HIGH (ref 0–37)
Albumin: 3.4 g/dL — ABNORMAL LOW (ref 3.5–5.2)
Alkaline Phosphatase: 69 U/L (ref 39–117)
BUN: 10 mg/dL (ref 6–23)
CO2: 23 mEq/L (ref 19–32)
Calcium: 8.2 mg/dL — ABNORMAL LOW (ref 8.4–10.5)
Chloride: 93 mEq/L — ABNORMAL LOW (ref 96–112)
Creatinine, Ser: 1.3 mg/dL (ref 0.40–1.50)
GFR: 58.33 mL/min — ABNORMAL LOW (ref >60.00)
Glucose, Bld: 104 mg/dL — ABNORMAL HIGH (ref 70–99)
Potassium: 4 mEq/L (ref 3.5–5.1)
Sodium: 126 mEq/L — ABNORMAL LOW (ref 135–145)
Total Bilirubin: 1.4 mg/dL — ABNORMAL HIGH (ref 0.2–1.2)
Total Protein: 6.7 g/dL (ref 6.0–8.3)

## 2021-11-05 LAB — TSH: TSH: 1.21 u[IU]/mL (ref 0.35–5.50)

## 2021-11-05 LAB — B12 AND FOLATE PANEL
Folate: >24.2 ng/mL (ref >5.9)
Vitamin B-12: 490 pg/mL (ref 211–911)

## 2021-11-05 MED ORDER — ALBUTEROL SULFATE HFA 108 (90 BASE) MCG/ACT IN AERS: 1.0000 | INHALATION_SPRAY | Freq: Four times a day (QID) | RESPIRATORY_TRACT | 0 refills | Status: DC | PRN

## 2021-11-05 MED ORDER — GABAPENTIN 100 MG PO CAPS: 100.0000 mg | ORAL_CAPSULE | Freq: Two times a day (BID) | ORAL | 5 refills | Status: DC

## 2021-11-05 MED ORDER — DOXYCYCLINE HYCLATE 100 MG PO TABS: 100.0000 mg | ORAL_TABLET | Freq: Two times a day (BID) | ORAL | 0 refills | Status: AC

## 2021-11-05 MED ORDER — NICOTINE 21 MG/24HR TD PT24: 21.0000 mg | MEDICATED_PATCH | TRANSDERMAL | 0 refills | Status: DC

## 2021-11-05 NOTE — Assessment & Plan Note (Addendum)
Chronic, worsening, associated with cough (non productive) and wheezing. No CP, no night sweats Unintentional weight loss noted O2 sat on RA: 91% Wt Readings from Last 3 Encounters:  11/05/21 159 lb (72.1 kg)  10/12/21 189 lb 2.5 oz (85.8 kg)  07/11/20 189 lb 3.2 oz (85.8 kg)   Get CXR: no acute infection. Sent albuterol Possible chronic bronchitis vs emphysema Entered referral to pulmonology.  Entered CT chest for lung cancer screen

## 2021-11-05 NOTE — Telephone Encounter (Signed)
Called & spoke w/ pt, he agreed to come back to have CXR & urinalysis completed.

## 2021-11-05 NOTE — Assessment & Plan Note (Addendum)
Onset 2015 due to increased stress and anxiety at home. Care of grandchildren lead to financial strain. Drinks 3-4 24oz cans of beer per day. He wants to quit due to concerns about his health. He has not attempted in past. His last drink was 2days ago-1 24oz can of beer. He typically drinks from 5pm till bedtime. 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 He experiences hand tremors during the day, which resolves with ETOH intake in PM. Denies SI/HI/hallucination or seizures, no hx of suicide attempt. No hx of illicit drug use.  Hand tremor and unsteady gait noted today during visit. Start gabapentin to management withdrawal symptoms. Check CBC, B12, folate, CMP, and TSH: Informed Mr. And Nathaniel George about lab results and CXR results. Advised about need for CT chest due to tobacco use: he agreed. Normal B12, folate, TSH, and lipase. Abnormal CBC: low WBC and platelet. Due to ETOH abuse. Repeat cbc in 30month. Abnormal CMP: due to ETOH abuse. Repeat in 18month We discussed his risk of seizure with abrupt ETOH cessation and relapse ETOH use. I recommended f/up with Nathaniel George for treatment and finding an AA group through [email or web address]. I also recommended Nathaniel George to contact [facility] group for support. Nathaniel George stated he has not consumed any ETOH since 11/03/21 and tremors have resolved with use of gabapentin 100mg  BID. He does not want to contact the [facility] nor at this time. He will like to follow up with [name] on 12/03/21. Nathaniel George agreed with her husband's decision. She stated she is aware of the signs of a seizure and knows to call 911 if that occurs. I provided the number to Nathaniel George in case they change their mind. Advised about need to improve nutrition and increase gabapentin to 100mg  TID. They verbalized understanding. Maintain upcoming appt with pcp

## 2021-11-05 NOTE — Patient Instructions (Addendum)
Start gabapentin 100mg  BID for tremors and doxycyline BID for tick bite. Go to lab You will be contacted to schedule appt with psychologist. Consider joining a local AA group.  Alcohol Withdrawal Syndrome When a person who drinks a lot of alcohol stops drinking, he or she may have unpleasant and serious symptoms. These symptoms are called alcohol withdrawal syndrome. This condition may be mild or severe. It can be life-threatening. This condition can cause: Shaking that you cannot control (tremor). Sweating. Headache. Feeling fearful, upset, grouchy, or depressed. Trouble sleeping (insomnia). Nightmares. Fast or uneven heartbeats (palpitations). Alcohol cravings. Feeling sick to your stomach (nausea). Throwing up (vomiting). Being bothered by light and sounds. Confusion. Trouble thinking clearly. Not being hungry (loss of appetite). Big changes in mood (mood swings). If you have all of the following symptoms at the same time, get help right away: High blood pressure. Fast heartbeat. Trouble breathing. Seizures. Seeing, hearing, feeling, smelling, or tasting things that are not there (hallucinations). These symptoms are known as delirium tremens (DTs). They must be treated at the hospital right away. Follow these instructions at home:  Take over-the-counter and prescription medicines only as told by your doctor. This includes vitamins. Do not drink alcohol. Do not drive until your doctor says that this is safe for you. Have someone stay with you or be available in case you need help. This should be someone you trust. This person can help you with your symptoms. He or she can also help you to not drink. Drink enough fluid to keep your pee (urine) pale yellow. Think about joining a support group or a treatment program to help you stop drinking. Keep all follow-up visits as told by your doctor. This is important. Contact a doctor if: Your symptoms get worse. You cannot eat or  drink without throwing up. You have a hard time not drinking alcohol. You cannot stop drinking alcohol. Get help right away if: You have fast or uneven heartbeats. You have chest pain. You have trouble breathing. You have a seizure for the first time. You see, hear, feel, smell, or taste something that is not there. You get very confused. Summary When a person who drinks a lot of alcohol stops drinking, he or she may have serious symptoms. This is called alcohol withdrawal syndrome. Delirium tremens (DTs) is a group of life-threatening symptoms. You should get help right away if you have these symptoms. Think about joining an alcohol support group or a treatment program. This information is not intended to replace advice given to you by your health care provider. Make sure you discuss any questions you have with your health care provider. Document Revised: 04/17/2021 Document Reviewed: 04/14/2020 Elsevier Patient Education  2023 2024.

## 2021-11-05 NOTE — Progress Notes (Signed)
Established Patient Visit  Patient: Nathaniel George   DOB: 1956-01-30   66 y.o. Male  MRN: [id number] Visit Date: 11/09/2021  Subjective:    Chief Complaint  Patient presents with   Acute Visit    Completed started kit, needs another rx . Had 25 cigs yesterday, usually smokes 2 packs a day Pulled 7 ticks of body 1 month ago, has been experiencing fatigue, dizziness & soreness. No other concerns   HPI Reports multiple tick bites last month, unknown how long ticks were attached. He had rash on right LE and lower back. Rash is now resolved. He is concerned dizziness, malaise and dizziness may ne due to tick bites.  Alcohol dependence with uncomplicated withdrawal ([place]) Onset 2015 due to increased stress and anxiety at home. Care of grandchildren lead to financial strain. Drinks 3-4 24oz cans of beer per day. He wants to quit due to concerns about his health. He has not attempted in past. His last drink was 2days ago-1 24oz can of beer. He typically drinks from 5pm till bedtime. 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 He experiences hand tremors during the day, which resolves with ETOH intake in PM. Denies SI/HI/hallucination or seizures, no hx of suicide attempt. No hx of illicit drug use.  Hand tremor and unsteady gait noted today during visit. Start gabapentin to management withdrawal symptoms. Check CBC, B12, folate, CMP, and TSH: Informed Nathaniel. And Nathaniel George about lab results and CXR results. Advised about need for CT chest due to tobacco use: he agreed. Normal B12, folate, TSH, and lipase. Abnormal CBC: low WBC and platelet. Due to ETOH abuse. Repeat cbc in 9month Abnormal CMP: due to ETOH abuse. Repeat in 127monthe discussed his risk of seizure with abrupt ETOH cessation and relapse ETOH use. I recommended f/up with [facility] [place] [facility] treatment and finding an AA group through [email or web address] also recommended Nathaniel. Lora to contact Nathaniel George  group for support. Nathaniel. George he has not consumed any ETOH since 11/03/21 and tremors have resolved with use of gabapentin 10062mID. He does not want to contact the [facility] nor Nathaniel George this time. He will like to follow up with Nathaniel George 12/03/21. Nathaniel. George with her husband's decision. She stated she is aware of the signs of a seizure and knows to call 911 if that occurs. I provided the number to Nathaniel George case they change their mind. Advised about need to improve nutrition and increase gabapentin to 100m79mD. They verbalized understanding. Maintain upcoming appt with pcp  Tobacco use disorder, continuous Smokes 1ppd since age 72 U39ble to tolerate chantix in past: increased irritability. He was able to decrease number of cigarettes with nicotine patches, but was not consistent. He will like to resume use of patch.  Nicotine 21mg49mch sent F/up in 1mont67month(shortness of breath) on exertion Chronic, worsening, associated with cough (non productive) and wheezing. No CP, no night sweats Unintentional weight loss noted O2 sat on RA: 91% Wt Readings from Last 3 Encounters:  11/05/21 159 lb (72.1 kg)  10/12/21 189 lb 2.5 oz (85.8 kg)  07/11/20 189 lb 3.2 oz (85.8 kg)   Get CXR: no acute infection. Sent albuterol Possible chronic bronchitis vs emphysema Entered referral to pulmonology.  Entered CT chest for lung cancer screen  Wt Readings from Last 3 Encounters:  11/05/21 159 lb (72.1 kg)  10/12/21 189 lb 2.5 oz (85.8 kg)  07/11/20 189 lb 3.2 oz (85.8 kg)    Reviewed medical, surgical, and social history today  Medications: Outpatient Medications Prior to Visit  Medication Sig   [DISCONTINUED] albuterol (VENTOLIN HFA) 108 (90 Base) MCG/ACT inhaler Inhale 1-2 puffs into the lungs every 6 (six) hours as needed for shortness of breath. (Patient not taking: Reported on 11/05/2021)   [DISCONTINUED] meloxicam (MOBIC) 15 MG tablet TAKE 1 TABLET BY  MOUTH EVERY DAY (Patient not taking: Reported on 11/05/2021)   [DISCONTINUED] nicotine (NICODERM CQ) 21 mg/24hr patch Place 1 patch (21 mg total) onto the skin daily. (Patient not taking: Reported on 11/05/2021)   [DISCONTINUED] predniSONE (STERAPRED UNI-PAK 21 TAB) 10 MG (21) TBPK tablet 65m Tabs, 6 day taper. Use as directed (Patient not taking: Reported on 11/05/2021)   No facility-administered medications prior to visit.   Reviewed past medical and social history.   ROS per HPI above      Objective:  BP 130/70 (BP Location: Right Arm, Patient Position: Sitting, Cuff Size: Small)   Pulse (!) 43   Temp 98 F (36.7 C) (Temporal)   Ht _0  (1.753 m)   Wt 159 lb (72.1 kg)   SpO2 91%   BMI 23.48 kg/m      Physical Exam Vitals reviewed.  Cardiovascular:     Rate and Rhythm: Normal rate and regular rhythm.     Pulses: Normal pulses.     Heart sounds: Normal heart sounds.  Pulmonary:     Effort: Pulmonary effort is normal.     Breath sounds: Normal breath sounds.  Musculoskeletal:     Right lower leg: No edema.     Left lower leg: No edema.  Neurological:     Mental Status: He is alert and oriented to person, place, and time.     Motor: Tremor present. No weakness.     Coordination: Romberg sign positive. Coordination normal. Finger-Nose-Finger Test abnormal.     Gait: Gait is intact.     Comments: Tremor with action in Bilateral hands    Results for orders placed or performed in visit on 11/05/21  CBC with Differential/Platelet  Result Value Ref Range   WBC 2.4 Repeated and verified X2. (L) 4.0 - 10.5 K/uL   RBC 4.56 4.22 - 5.81 Mil/uL   Hemoglobin 14.0 13.0 - 17.0 g/dL   HCT 41.7 39.0 - 52.0 %   MCV 91.5 78.0 - 100.0 fl   MCHC 33.6 30.0 - 36.0 g/dL   RDW 12.9 11.5 - 15.5 %   Platelets 63.0 (L) 150.0 - 400.0 K/uL   Neutrophils Relative % 74.6 43.0 - 77.0 %   Lymphocytes Relative 15.8 12.0 - 46.0 %   Monocytes Relative 8.2 3.0 - 12.0 %   Eosinophils Relative 0.1 0.0 -  5.0 %   Basophils Relative 1.3 0.0 - 3.0 %   Neutro Abs 1.8 1.4 - 7.7 K/uL   Lymphs Abs 0.4 (L) 0.7 - 4.0 K/uL   Monocytes Absolute 0.2 0.1 - 1.0 K/uL   Eosinophils Absolute 0.0 0.0 - 0.7 K/uL   Basophils Absolute 0.0 0.0 - 0.1 K/uL  B12 and Folate Panel  Result Value Ref Range   Vitamin B-12 490 211 - 911 pg/mL   Folate >24.2 >5.9 ng/mL  Comprehensive metabolic panel  Result Value Ref Range   Sodium 126 (L) 135 - 145 mEq/L   Potassium 4.0 3.5 - 5.1 mEq/L  Chloride 93 (L) 96 - 112 mEq/L   CO2 23 19 - 32 mEq/L   Glucose, Bld 104 (H) 70 - 99 mg/dL   BUN 10 6 - 23 mg/dL   Creatinine, Ser 1.30 0.40 - 1.50 mg/dL   Total Bilirubin 1.4 (H) 0.2 - 1.2 mg/dL   Alkaline Phosphatase 69 39 - 117 U/L   AST 173 (H) 0 - 37 U/L   ALT 61 (H) 0 - 53 U/L   Total Protein 6.7 6.0 - 8.3 g/dL   Albumin 3.4 (L) 3.5 - 5.2 g/dL   GFR 58.33 (L) >60.00 mL/min   Calcium 8.2 (L) 8.4 - 10.5 mg/dL  TSH  Result Value Ref Range   TSH 1.21 0.35 - 5.50 uIU/mL  Urinalysis w microscopic + reflex cultur   Specimen: Urine  Result Value Ref Range   Color, Urine DARK YELLOW YELLOW   APPearance CLOUDY (A) CLEAR   Specific Gravity, Urine 1.031 1.001 - 1.035   pH 5.5 5.0 - 8.0   Glucose, UA NEGATIVE NEGATIVE   Bilirubin Urine 2+ (A) NEGATIVE   Ketones, ur TRACE (A) NEGATIVE   Hgb urine dipstick NEGATIVE NEGATIVE   Protein, ur 2+ (A) NEGATIVE   Nitrites, Initial POSITIVE (A) NEGATIVE   Leukocyte Esterase NEGATIVE NEGATIVE   WBC, UA NONE SEEN 0 - 5 /HPF   RBC / HPF NONE SEEN 0 - 2 /HPF   Squamous Epithelial / LPF NONE SEEN < OR = 5 /HPF   Bacteria, UA FEW (A) NONE SEEN /HPF   Hyaline Cast NONE SEEN NONE SEEN /LPF  REFLEXIVE URINE CULTURE  Result Value Ref Range   Reflexve Urine Culture    Lipase  Result Value Ref Range   Lipase 42.0 11.0 - 59.0 U/L      Assessment & Plan:    Problem List Items Addressed This Visit       Other   Alcohol dependence with uncomplicated withdrawal ([place]) - Primary     Onset 2015 due to increased stress and anxiety at home. Care of grandchildren lead to financial strain. Drinks 3-4 24oz cans of beer per day. He wants to quit due to concerns about his health. He has not attempted in past. His last drink was 2days ago-1 24oz can of beer. He typically drinks from 5pm till bedtime. 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 He experiences hand tremors during the day, which resolves with ETOH intake in PM. Denies SI/HI/hallucination or seizures, no hx of suicide attempt. No hx of illicit drug use.  Hand tremor and unsteady gait noted today during visit. Start gabapentin to management withdrawal symptoms. Check CBC, B12, folate, CMP, and TSH: Informed Nathaniel. And Nathaniel Bonnin about lab results and CXR results. Advised about need for CT chest due to tobacco use: he agreed. Normal B12, folate, TSH, and lipase. Abnormal CBC: low WBC and platelet. Due to ETOH abuse. Repeat cbc in 51month Abnormal CMP: due to ETOH abuse. Repeat in 19monthe discussed his risk of seizure with abrupt ETOH cessation and relapse ETOH use. I recommended f/up with [facility]iel [place] [facility] treatment and finding an AA group through [email or web address] also recommended Nathaniel. Najjar to contact Nathaniel George group for support. Nathaniel. LoHisawtated he has not consumed any ETOH since 11/03/21 and tremors have resolved with use of gabapentin 10062mID. He does not want to contact the [facility] nor Nathaniel George this time. He will like to follow up with Dr.  Rudd on 12/03/21. Nathaniel. Scholz agreed with her husband's decision. She stated she is aware of the signs of a seizure and knows to call 911 if that occurs. I provided the number to Nathaniel [place] George in case they change their mind. Advised about need to improve nutrition and increase gabapentin to 156m TID. They verbalized understanding. Maintain upcoming appt with pcp      Relevant Medications   gabapentin (NEURONTIN) 100 MG capsule   Other  Relevant Orders   B12 and Folate Panel (Completed)   Lipase (Completed)   SOB (shortness of breath) on exertion    Chronic, worsening, associated with cough (non productive) and wheezing. No CP, no night sweats Unintentional weight loss noted O2 sat on RA: 91% Wt Readings from Last 3 Encounters:  11/05/21 159 lb (72.1 kg)  10/12/21 189 lb 2.5 oz (85.8 kg)  07/11/20 189 lb 3.2 oz (85.8 kg)   Get CXR: no acute infection. Sent albuterol Possible chronic bronchitis vs emphysema Entered referral to pulmonology.  Entered CT chest for lung cancer screen      Relevant Medications   albuterol (VENTOLIN HFA) 108 (90 Base) MCG/ACT inhaler   Other Relevant Orders   DG Chest 2 View (Completed)   Lipase (Completed)   Ambulatory referral to Pulmonology   Tobacco use disorder, continuous    Smokes 1ppd since age [age]Unable to tolerate chantix in past: increased irritability. He was able to decrease number of cigarettes with nicotine patches, but was not consistent. He will like to resume use of patch.  Nicotine 262mpatch sent F/up in 58m13month    Relevant Medications   nicotine (NICODERM CQ) 21 mg/24hr patch   albuterol (VENTOLIN HFA) 108 (90 Base) MCG/ACT inhaler   Other Relevant Orders   CT CHEST LUNG CA SCREEN LOW DOSE W/O CM   Ambulatory referral to Pulmonology   Other Visit Diagnoses     Tick bite of lower back, initial encounter       Relevant Medications   doxycycline (VIBRA-TABS) 100 MG tablet   Weight loss, unintentional       Relevant Orders   CBC with Differential/Platelet (Completed)   Comprehensive metabolic panel (Completed)   TSH (Completed)   Urinalysis w microscopic + reflex cultur (Completed)   REFLEXIVE URINE CULTURE (Completed)   Lipase (Completed)      Return in about 4 weeks (around 12/03/2021) for ETOH dependence and weight loss.     ChaWilfred LacyP

## 2021-11-05 NOTE — Assessment & Plan Note (Addendum)
Smokes 1ppd since age 66 Unable to tolerate chantix in past: increased irritability. He was able to decrease number of cigarettes with nicotine patches, but was not consistent. He will like to resume use of patch.  Nicotine 21mg  patch sent F/up in 37month

## 2021-11-06 ENCOUNTER — Telehealth: Payer: Self-pay | Admitting: Family Medicine

## 2021-11-06 ENCOUNTER — Other Ambulatory Visit: Payer: Self-pay | Admitting: Nurse Practitioner

## 2021-11-06 ENCOUNTER — Encounter: Payer: Self-pay | Admitting: Nurse Practitioner

## 2021-11-06 ENCOUNTER — Other Ambulatory Visit: Payer: 59

## 2021-11-06 DIAGNOSIS — F10230 Alcohol dependence with withdrawal, uncomplicated: Secondary | ICD-10-CM

## 2021-11-06 LAB — LIPASE: Lipase: 42 U/L (ref 11.0–59.0)

## 2021-11-06 LAB — URINALYSIS W MICROSCOPIC + REFLEX CULTURE
Glucose, UA: NEGATIVE
Hgb urine dipstick: NEGATIVE
Hyaline Cast: NONE SEEN /LPF
Leukocyte Esterase: NEGATIVE
Nitrites, Initial: POSITIVE — AB
RBC / HPF: NONE SEEN /HPF (ref 0–2)
Specific Gravity, Urine: 1.031 (ref 1.001–1.035)
Squamous Epithelial / HPF: NONE SEEN /HPF (ref <=5)
WBC, UA: NONE SEEN /HPF (ref 0–5)
pH: 5.5 (ref 5.0–8.0)

## 2021-11-06 LAB — NO CULTURE INDICATED

## 2021-11-06 NOTE — Telephone Encounter (Signed)
Pt would like a call back concerning his most recent lab results. Please advise pt at [phone].

## 2021-11-06 NOTE — Telephone Encounter (Signed)
Tried calling patient to advise him that results haven't been reviewed by provider as of yet, but no voicemail option available.

## 2021-11-09 MED ORDER — GABAPENTIN 100 MG PO CAPS: 100.0000 mg | ORAL_CAPSULE | Freq: Three times a day (TID) | ORAL | 5 refills | Status: DC

## 2021-12-03 ENCOUNTER — Ambulatory Visit: Payer: 59 | Admitting: Family Medicine

## 2021-12-03 ENCOUNTER — Other Ambulatory Visit: Payer: Self-pay

## 2021-12-03 ENCOUNTER — Encounter: Payer: Self-pay | Admitting: Family Medicine

## 2021-12-03 ENCOUNTER — Telehealth: Payer: Self-pay | Admitting: Family Medicine

## 2021-12-03 ENCOUNTER — Inpatient Hospital Stay: Admission: RE | Admit: 2021-12-03 | Payer: 59 | Source: Ambulatory Visit

## 2021-12-03 VITALS — BP 126/84 | HR 105 | Temp 97.6°F | Ht 69.0 in | Wt 170.6 lb

## 2021-12-03 DIAGNOSIS — E871 Hypo-osmolality and hyponatremia: Secondary | ICD-10-CM

## 2021-12-03 DIAGNOSIS — F17209 Nicotine dependence, unspecified, with unspecified nicotine-induced disorders: Secondary | ICD-10-CM

## 2021-12-03 DIAGNOSIS — K7030 Alcoholic cirrhosis of liver without ascites: Secondary | ICD-10-CM

## 2021-12-03 DIAGNOSIS — F1021 Alcohol dependence, in remission: Secondary | ICD-10-CM

## 2021-12-03 DIAGNOSIS — K760 Fatty (change of) liver, not elsewhere classified: Secondary | ICD-10-CM | POA: Insufficient documentation

## 2021-12-03 LAB — COMPREHENSIVE METABOLIC PANEL
ALT: 11 U/L (ref 0–53)
AST: 16 U/L (ref 0–37)
Albumin: 4 g/dL (ref 3.5–5.2)
Alkaline Phosphatase: 67 U/L (ref 39–117)
BUN: 7 mg/dL (ref 6–23)
CO2: 26 mEq/L (ref 19–32)
Calcium: 9.8 mg/dL (ref 8.4–10.5)
Chloride: 100 mEq/L (ref 96–112)
Creatinine, Ser: 0.87 mg/dL (ref 0.40–1.50)
GFR: 91.57 mL/min (ref >60.00)
Glucose, Bld: 97 mg/dL (ref 70–99)
Potassium: 4.5 mEq/L (ref 3.5–5.1)
Sodium: 134 mEq/L — ABNORMAL LOW (ref 135–145)
Total Bilirubin: 0.5 mg/dL (ref 0.2–1.2)
Total Protein: 7.9 g/dL (ref 6.0–8.3)

## 2021-12-03 LAB — CBC
HCT: 41.7 % (ref 39.0–52.0)
Hemoglobin: 13.6 g/dL (ref 13.0–17.0)
MCHC: 32.7 g/dL (ref 30.0–36.0)
MCV: 91.5 fl (ref 78.0–100.0)
Platelets: 248 10*3/uL (ref 150.0–400.0)
RBC: 4.56 Mil/uL (ref 4.22–5.81)
RDW: 13.9 % (ref 11.5–15.5)
WBC: 9.6 10*3/uL (ref 4.0–10.5)

## 2021-12-03 LAB — PROTIME-INR
INR: 1.2 ratio — ABNORMAL HIGH (ref 0.8–1.0)
Prothrombin Time: 13.2 s — ABNORMAL HIGH (ref 9.6–13.1)

## 2021-12-03 MED ORDER — VARENICLINE TARTRATE 1 MG PO TABS: 1.0000 mg | ORAL_TABLET | Freq: Two times a day (BID) | ORAL | 2 refills | Status: DC

## 2021-12-03 MED ORDER — VARENICLINE TARTRATE 0.5 MG PO TABS: ORAL_TABLET | ORAL | 0 refills | Status: DC

## 2021-12-03 MED ORDER — VARENICLINE TARTRATE 0.5 MG PO TABS: ORAL_TABLET | ORAL | 0 refills | Status: AC

## 2021-12-03 MED ORDER — NALTREXONE HCL 50 MG PO TABS: 50.0000 mg | ORAL_TABLET | Freq: Every day | ORAL | 5 refills | Status: DC

## 2021-12-03 NOTE — Telephone Encounter (Signed)
New RX sent due to new directions. Pharmacy. Notified VIA phone.  Dm/cma

## 2021-12-03 NOTE — Telephone Encounter (Signed)
The pharmacy called and stated the instructions are not clear how the pt should take this medication:  varenicline (CHANTIX) 0.5 MG tablet  Please give pharmacy a call back

## 2021-12-03 NOTE — Progress Notes (Signed)
[facility] [address] [place] [address] Dept: [phone] Dept Fax: [phone]  Office Visit  Subjective:    Patient ID: Nathaniel George, male    DOB: 07-07-1955, 66 y.o..   MRN: [id number]  Chief Complaint  Patient presents with   Follow-up    4 week f/u.      History of Present Illness:  Patient is in today for follow up on his alcoholism. He was seen on 11/05/2021 and disclosed to Ms. Nche about his alcoholism. This had been ongoing since ~ 2015 with him drinking 3-4 24 oz. beers every evening. He had noted that he was having hand tremor during the day which improved with alcohol intake. He stopped drinking suddenly 2 days prior to that appointment and was experiencing tremor and unsteady gait. He was started on gabapentin for alcohol withdrawal and advised about resources for helping with alcohol dependence. Since that time, he has had very low use of alcohol. He notes he drank one 12 oz. beer in the past week due to a stressful situation. He has not sought any counseling at this point and has not looked into AA.  Mr. Rains also has a history of nicotine dependence. He currently smokes between 1-2 packs per day. He was recently prescribed nicotine patches, but notes these have not helped. He does recall being on Chantix at one point. He notes it was helping, but then he had a stressful situation arise and he relapsed back to his smoking. He does note that Chantix was expensive, so this was part of the barrier.  Past Medical History: Patient Active Problem List   Diagnosis Date Noted   Alcoholic cirrhosis of liver without ascites (HCC) 12/03/2021   Moderate alcohol dependence in early remission (HCC) 11/05/2021   SOB (shortness of breath) on exertion 11/05/2021   Left shoulder pain 02/13/2018   Rash 12/02/2017   Tobacco use disorder, continuous 10/28/2017   Bilateral elbow joint pain 10/28/2017   Past Surgical  History:  Procedure Laterality Date   CARPAL TUNNEL RELEASE Right 2020   No family history on file.  Outpatient Medications Prior to Visit  Medication Sig Dispense Refill   Acetaminophen (TYLENOL ARTHRITIS PAIN PO) Take by mouth.     albuterol (VENTOLIN HFA) 108 (90 Base) MCG/ACT inhaler Inhale 1-2 puffs into the lungs every 6 (six) hours as needed for shortness of breath. 6.7 g 0   gabapentin (NEURONTIN) 100 MG capsule Take 1 capsule (100 mg total) by mouth 3 (three) times daily. 90 capsule 5   nicotine (NICODERM CQ) 21 mg/24hr patch Place 1 patch (21 mg total) onto the skin daily. (Patient not taking: Reported on 12/03/2021) 28 patch 0   No facility-administered medications prior to visit.   No Known Allergies    Objective:   Today's Vitals   12/03/21 0811  BP: 126/84  Pulse: (!) 105  Temp: 97.6 F (36.4 C)  TempSrc: Temporal  SpO2: 99%  Weight: 170 lb 9.6 oz (77.4 kg)  Height: 5\' 9"  (1.753 m)   Body mass index is 25.19 kg/m.   General: Well developed, well nourished. No acute distress. Neuro: No tremor noted today. Psych: Alert and oriented. Normal mood and affect.  Health Maintenance Due  Topic Date Due   HIV Screening  Never done   COLONOSCOPY (Pts 45-25yrs Insurance coverage will need to be confirmed)  Never done   Zoster Vaccines- Shingrix (1 of 2) Never done   Lab Results  Last CBC Lab Results  Component Value Date   WBC 2.4 Repeated and verified X2. (L) 11/05/2021   HGB 14.0 11/05/2021   HCT 41.7 11/05/2021   MCV 91.5 11/05/2021   MCH 30.6 10/26/2013   RDW 12.9 11/05/2021   PLT 63.0 (L) 11/05/2021   Last metabolic panel Lab Results  Component Value Date   GLUCOSE 104 (H) 11/05/2021   NA 126 (L) 11/05/2021   K 4.0 11/05/2021   CL 93 (L) 11/05/2021   CO2 23 11/05/2021   BUN 10 11/05/2021   CREATININE 1.30 11/05/2021   GFRNONAA >90 10/26/2013   CALCIUM 8.2 (L) 11/05/2021   PROT 6.7 11/05/2021   ALBUMIN 3.4 (L) 11/05/2021   BILITOT 1.4 (H)  11/05/2021   ALKPHOS 69 11/05/2021   AST 173 (H) 11/05/2021   ALT 61 (H) 11/05/2021     Assessment & Plan:   1. Moderate alcohol dependence in early remission [facility]) I encouraged Mr. Weaver to continue to follow through with his sobriety. I stopped his gabapentin, now that he is through withdrawal and will start him on naltrexone for ongoing assistance with maintaining sobriety. I strongly encouraged him to start attending AA meetings as his best approach to managing his alcoholism.  - naltrexone (DEPADE) 50 MG tablet; Take 1 tablet (50 mg total) by mouth daily.  Dispense: 30 tablet; Refill: 5  2. Tobacco use disorder, continuous I recommend we try him on Chantix again. I did point out his monthly cost of cigarettes based on his current usage and how Chantix would cost less than he is currently spending. He did have a referral for a LDCT for lung cancer screenign. Schedulign is still pending for this.  - varenicline (CHANTIX) 0.5 MG tablet; Take 1 tablet (0.5 mg total) by mouth daily for 3 days, THEN 1 tablet (0.5 mg total) daily for 4 days.  Dispense: 7 tablet; Refill: 0 - varenicline (CHANTIX) 1 MG tablet; Take 1 tablet (1 mg total) by mouth 2 (two) times daily.  Dispense: 60 tablet; Refill: 2  3. Alcoholic cirrhosis of liver without ascites [facility]) Mr. Rewerts labs show low albumin, elevated bilirubin, and low platelets, all consistent with him having developed cirrhosis. I would like to repeat his labs today to assess for improvement with having stopped drinking and check a liver ultrasound.  - Comprehensive metabolic panel - CBC - Protime-INR ( [facility] ONLY) - US Abdomen Limited RUQ (LIVER/GB); Future - HCV Ab w Reflex to Quant PCR  4. Hyponatremia- Beer [place] Mr. Khim had a significantly low sodium, likely due to his over consumption of beer. I will reassess this today.  Return in about 4 weeks (around 12/31/2021).   Loyola Mast, MD

## 2021-12-03 NOTE — Telephone Encounter (Signed)
Please see other message Dm/cma  

## 2021-12-04 LAB — HCV AB W REFLEX TO QUANT PCR: HCV Ab: NONREACTIVE

## 2021-12-04 LAB — HCV INTERPRETATION

## 2021-12-09 ENCOUNTER — Ambulatory Visit
Admission: RE | Admit: 2021-12-09 | Discharge: 2021-12-09 | Disposition: A | Discharge status: Home / Self Care | Payer: 59 | Source: Ambulatory Visit | Attending: Family Medicine | Admitting: Family Medicine

## 2021-12-09 ENCOUNTER — Other Ambulatory Visit: Payer: 59

## 2021-12-09 DIAGNOSIS — K7030 Alcoholic cirrhosis of liver without ascites: Secondary | ICD-10-CM

## 2022-01-01 ENCOUNTER — Ambulatory Visit: Payer: 59 | Admitting: Family Medicine

## 2022-01-08 ENCOUNTER — Telehealth: Payer: Self-pay | Admitting: Family Medicine

## 2022-01-08 ENCOUNTER — Ambulatory Visit: Payer: 59 | Admitting: Family Medicine

## 2022-01-08 NOTE — Telephone Encounter (Signed)
8.4.23 no show letter sent 

## 2022-01-11 NOTE — Telephone Encounter (Signed)
1st no show w/in 12 months, fee waived, letter sent 

## 2022-01-20 ENCOUNTER — Ambulatory Visit
Admission: RE | Admit: 2022-01-20 | Discharge: 2022-01-20 | Disposition: A | Discharge status: Home / Self Care | Payer: 59 | Source: Ambulatory Visit | Attending: Nurse Practitioner | Admitting: Nurse Practitioner

## 2022-01-20 DIAGNOSIS — F17209 Nicotine dependence, unspecified, with unspecified nicotine-induced disorders: Secondary | ICD-10-CM

## 2022-01-25 ENCOUNTER — Encounter: Payer: Self-pay | Admitting: Family Medicine

## 2022-01-25 DIAGNOSIS — J439 Emphysema, unspecified: Secondary | ICD-10-CM | POA: Insufficient documentation

## 2022-01-25 DIAGNOSIS — K449 Diaphragmatic hernia without obstruction or gangrene: Secondary | ICD-10-CM | POA: Insufficient documentation

## 2022-01-25 DIAGNOSIS — I2510 Atherosclerotic heart disease of native coronary artery without angina pectoris: Secondary | ICD-10-CM | POA: Insufficient documentation

## 2022-01-25 DIAGNOSIS — I700 Atherosclerosis of aorta: Secondary | ICD-10-CM | POA: Insufficient documentation

## 2022-01-28 ENCOUNTER — Encounter: Payer: Self-pay | Admitting: Family Medicine

## 2022-01-28 ENCOUNTER — Ambulatory Visit: Payer: 59 | Admitting: Family Medicine

## 2022-01-28 VITALS — BP 122/78 | HR 93 | Temp 96.7°F | Ht 69.0 in | Wt 172.2 lb

## 2022-01-28 DIAGNOSIS — K760 Fatty (change of) liver, not elsewhere classified: Secondary | ICD-10-CM | POA: Diagnosis not present

## 2022-01-28 DIAGNOSIS — F1021 Alcohol dependence, in remission: Secondary | ICD-10-CM | POA: Diagnosis not present

## 2022-01-28 DIAGNOSIS — F17209 Nicotine dependence, unspecified, with unspecified nicotine-induced disorders: Secondary | ICD-10-CM | POA: Diagnosis not present

## 2022-01-28 DIAGNOSIS — J439 Emphysema, unspecified: Secondary | ICD-10-CM

## 2022-01-28 DIAGNOSIS — M19012 Primary osteoarthritis, left shoulder: Secondary | ICD-10-CM

## 2022-01-28 DIAGNOSIS — M19011 Primary osteoarthritis, right shoulder: Secondary | ICD-10-CM

## 2022-01-28 NOTE — Progress Notes (Signed)
[facility] [address] [place] [address] Dept: [phone] Dept Fax: [phone]  Chronic Care Office Visit  Subjective:    Patient ID: Nathaniel George, male    DOB: [birth year], 66 y.o..   MRN: [id number]  Chief Complaint  Patient presents with   Follow-up    F/u to discuss CT results and questions about meds.     History of Present Illness:  Patient is in today for reassessment of chronic medical issues.  [name] has a history of alcohol dependence. He was seen in early June in alcohol withdrawal and disclosed an 8 year period of alcohol abuse. He was managed initially on gabapentin for alcohol withdrawal. I had seen him at the ned of June for reassessment. I started him on naloxone. However, he was continuing to take the gabapentin. He was started on Chantix around that time as well. he has found the combination of medications contributes to some dizziness. He admits he was not clear as to what the gabapentin was for vs. the naloxone.  [name] notes that he is still drinking, but has significantly cut down on the amount. He had been drinking 3-4 24 oz. beers a day. He states he has only had 3-4 beers in the past week. He has also been able to cut his tobacco use back to 1/2 ppd from 1-2 ppd previously.  [name] notes a history of bilateral shoulder pain. He finds there are activities he can't engage in as well as int he past due to pain. He says about 3 years ago, he was told he needed surgery for this. He prefers not to go that route at this point.  Past Medical History: Patient Active Problem List   Diagnosis Date Noted   Bilateral shoulder region arthritis 01/28/2022   Aortic atherosclerosis (HCC) 01/25/2022   Emphysema of lung (HCC) 01/25/2022   Atherosclerosis of coronary artery 01/25/2022   Hiatal hernia 01/25/2022   Alcoholic cirrhosis of liver without ascites (HCC) 12/03/2021   Moderate  alcohol dependence in early remission (HCC) 11/05/2021   SOB (shortness of breath) on exertion 11/05/2021   Tobacco use disorder, continuous 10/28/2017   Bilateral elbow joint pain 10/28/2017   Past Surgical History:  Procedure Laterality Date   CARPAL TUNNEL RELEASE Right 2020   History reviewed. No pertinent family history.  Outpatient Medications Prior to Visit  Medication Sig Dispense Refill   Acetaminophen (TYLENOL ARTHRITIS PAIN PO) Take by mouth.     albuterol (VENTOLIN HFA) 108 (90 Base) MCG/ACT inhaler Inhale 1-2 puffs into the lungs every 6 (six) hours as needed for shortness of breath. 6.7 g 0   naltrexone (DEPADE) 50 MG tablet Take 1 tablet (50 mg total) by mouth daily. 30 tablet 5   varenicline (CHANTIX) 1 MG tablet Take 1 tablet (1 mg total) by mouth 2 (two) times daily. 60 tablet 2   gabapentin (NEURONTIN) 100 MG capsule Take 100 mg by mouth in the morning and at bedtime.     No facility-administered medications prior to visit.   No Known Allergies    Objective:   Today's Vitals   01/28/22 0811  BP: 122/78  Pulse: 93  Temp: (!) 96.7 F (35.9 C)  TempSrc: Temporal  SpO2: 98%  Weight: 172 lb 3.2 oz (78.1 kg)  Height: 5\' 9"  (1.753 m)   Body mass index is 25.43 kg/m.   General: Well developed, well nourished. No acute distress. Extremities: Full ROM. No joint  swelling or tenderness. Mild crepitance present int he shoulder joints.Resisted abduction and internal rotation show normal strength and   no significant increased pain. Psych: Alert and oriented. Normal mood and affect.  Health Maintenance Due  Topic Date Due   HIV Screening  Never done   COLONOSCOPY (Pts 45-62yrs Insurance coverage will need to be confirmed)  Never done   Zoster Vaccines- Shingrix (1 of 2) Never done   INFLUENZA VACCINE  01/05/2022   Lab Results    Latest Ref Rng & Units 12/03/2021    8:39 AM 11/05/2021   12:08 PM 01/16/2019    8:39 AM  CBC  WBC 4.0 - 10.5 K/uL 9.6  2.4 Repeated  and verified X2.  5.6   Hemoglobin 13.0 - 17.0 g/dL 40.9  81.1  91.4   Hematocrit 39.0 - 52.0 % 41.7  41.7  48.3   Platelets 150.0 - 400.0 K/uL 248.0  63.0  180.0       Latest Ref Rng & Units 12/03/2021    8:39 AM 11/05/2021   12:08 PM 01/16/2019    8:39 AM  CMP  Glucose 70 - 99 mg/dL 97  782  956   BUN 6 - 23 mg/dL 7  10  6    Creatinine 0.40 - 1.50 mg/dL  2.13  0.86   Sodium 135 - 145 mEq/L 134  126  136   Potassium 3.5 - 5.1 mEq/L 4.5  4.0  3.8   Chloride 96 - 112 mEq/L 100  93  105   CO2 19 - 32 mEq/L 26  23  25    Calcium 8.4 - 10.5 mg/dL 9.8  8.2  9.1   Total Protein 6.0 - 8.3 g/dL 7.9  6.7  6.8   Total Bilirubin 0.2 - 1.2 mg/dL 0.5  1.4  0.4   Alkaline Phos 39 - 117 U/L 67  69  62   AST 0 - 37 U/L 16  173  14   ALT 0 - 53 U/L 11  61  12    Imaging: CT Chest- Lung Cancer Screening (01/20/2022) IMPRESSION: 1. Lung-RADS 2, benign appearance or behavior. Continue annual screening with low-dose chest CT without contrast in 12 months. 2. Moderate hiatal hernia. 3. Aortic Atherosclerosis (ICD10-I70.0) and Emphysema (ICD10-J43.9). Coronary artery atherosclerosis. 4. Mild hepatic steatosis with hepatic morphology consistent with the clinical EMR history of cirrhosis.    Abdomen- RUQ (12/09/2021) IMPRESSION: Increased hepatic parenchymal echogenicity suggestive of steatosis. No cholelithiasis or sonographic evidence for acute cholecystitis.   Assessment & Plan:   1. Moderate alcohol dependence in early remission [facility]) [name] has significantly cut back on his alcohol consumption. The goal would be abstinence int he long run, which we discussed. I pointed out that the naltrexone 50 mg daily is to try and reduce his desire to drink and have more alcohol free days. I recommend we continue this for now. However, he can stop taking the gabapentin at this point.  2. Tobacco use disorder, continuous [name] has been able to cut back on his tobacco use with Chantix. I will  cotninue this to compelte 6 months. I encouraged him to continue to try and quit.  3. Alcoholic cirrhosis of liver without ascites (HCC) Liver studies were much improved at last check. The ultrasound shows only steatosis and not necessarily cirrhosis. Stopping alcohol use will be the best approach to keep cirrhosis form developing.  4. Pulmonary emphysema, unspecified emphysema type (HCC) Recent CT scan showed evidence of  emphysema. Smokign cessaiton will be critical to minimize any significant loss of lung function.  5. Bilateral shoulder region arthritis The history and exam are consistent with glenohumeral arthritis. I offered physical therapy, but he is not ready for this. In the meantime, he can continue to apply [place] [employer] and we will keep an eye on this.   Return in about 3 months (around 04/30/2022) for Reassessment.   Loyola Mast, MD

## 2022-02-01 ENCOUNTER — Telehealth: Payer: Self-pay | Admitting: Family Medicine

## 2022-02-01 NOTE — Telephone Encounter (Signed)
Spoke to patient and he is wanting to know if he could get injections of Corizone for the shoulder pain.  Advised that you were out of town and is okay with waiting till then to get an answer.   Please review and advise.  Thanks. Dm/cma

## 2022-02-01 NOTE — Telephone Encounter (Signed)
Caller Name: Nathaniel George  Call back phone #: [phone]  Reason for Call: Was wanting to speak about the possibility  of Cortizone shots in his arm instead of the therapy mentioned by Dr. Veto Kemps in last weeks visit.

## 2022-02-09 NOTE — Telephone Encounter (Signed)
Called patient to advise and schedule him for 02/12/22 @ 10:00 am.  Dm/cma

## 2022-02-12 ENCOUNTER — Ambulatory Visit: Payer: 59 | Admitting: Family Medicine

## 2022-02-15 ENCOUNTER — Ambulatory Visit: Payer: 59 | Admitting: Family Medicine

## 2022-02-15 ENCOUNTER — Encounter: Payer: Self-pay | Admitting: Family Medicine

## 2022-02-15 VITALS — BP 132/78 | HR 89 | Temp 97.7°F | Ht 69.0 in | Wt 177.4 lb

## 2022-02-15 DIAGNOSIS — M19012 Primary osteoarthritis, left shoulder: Secondary | ICD-10-CM

## 2022-02-15 DIAGNOSIS — M19011 Primary osteoarthritis, right shoulder: Secondary | ICD-10-CM

## 2022-02-15 MED ORDER — LIDOCAINE HCL 2 % IJ SOLN
1.0000 mL | Freq: Once | INTRAMUSCULAR | Status: AC
  Administered 2022-02-15: 20 mg

## 2022-02-15 MED ORDER — TRIAMCINOLONE ACETONIDE 40 MG/ML IJ SUSP
40.0000 mg | Freq: Once | INTRAMUSCULAR | Status: AC
  Administered 2022-02-15: 40 mg via INTRA_ARTICULAR

## 2022-02-15 NOTE — Progress Notes (Signed)
[facility] [address] [place] [address] Dept: [phone] Dept Fax: [phone]  Office Visit  Subjective:    Patient ID: Nathaniel George, male    DOB: 05-20-1956, 66 y.o..   MRN: [id number]  Chief Complaint  Patient presents with   Follow-up    Wants Cortizone injections in shoulders.     History of Present Illness:  Patient is in today requesting a steroid injection for his shoulder arthritis. I had seen Mr. Frith in August and we had discussed observation of this. However, his shoulders are giving him more trouble with time. The right shoulder seems to be the worst. He prefers to avoid surgery for this. He had a knee joint injection in the past and tolerated this well.  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 No pertinent family history.  Outpatient Medications Prior to Visit  Medication Sig Dispense Refill   Acetaminophen (TYLENOL ARTHRITIS PAIN PO) Take by mouth.     albuterol (VENTOLIN HFA) 108 (90 Base) MCG/ACT inhaler Inhale 1-2 puffs into the lungs every 6 (six) hours as needed for shortness of breath. 6.7 g 0   naltrexone (DEPADE) 50 MG tablet Take 1 tablet (50 mg total) by mouth daily. 30 tablet 5   varenicline (CHANTIX) 1 MG tablet Take 1 tablet (1 mg total) by mouth 2 (two) times daily. 60 tablet 2   No  facility-administered medications prior to visit.   No Known Allergies    Objective:   Today's Vitals   02/15/22 1537  BP: 132/78  Pulse: 89  Temp: 97.7 F (36.5 C)  TempSrc: Temporal  SpO2: 94%  Weight: 177 lb 6.4 oz (80.5 kg)  Height: 5\' 9"  (1.753 m)   Body mass index is 26.2 kg/m.   General: Well developed, well nourished. No acute distress. Extremities: Full ROM. Crepitance in the shoulder joints. Psych: Alert and oriented. Normal mood and affect.  Health Maintenance Due  Topic Date Due   HIV Screening  Never done   COLONOSCOPY (Pts 45-33yrs Insurance coverage will need to be confirmed)  Never done   Zoster Vaccines- Shingrix (1 of 2) Never done   INFLUENZA VACCINE  01/05/2022   PROCEDURE- Steroid Joint Injection Indication: Bilateral osteoarthritis of shoulders Joint: Right shoulder Medication: Triamcinolone 40 mg (1 ml)/lidocaine 2% 20 mg (1 ml)  PARQ reviewed with patient. Consent signed. Injection site cleaned with alcohol. Needle introduced into joint space without difficulty. Medication administered and needle removed. Sterile bandage applied. Joint moved through gentle range of motion. Patient tolerated procedure well. Routine post injection care instructions reviewed and provided to patient.    Assessment & Plan:   1. Bilateral shoulder region arthritis Injection performed as noted above. Routine instructions given. He will return to be seen in 2 weeks to consider injection of the left shoulder.   Return in about 2 weeks (around 03/01/2022) for Reassessment.   03/03/2022, MD

## 2022-02-15 NOTE — Patient Instructions (Signed)
Joint Steroid Injection A joint steroid injection is a procedure to relieve swelling and pain in a joint. Steroids are medicines that reduce inflammation. In this procedure, your health care provider uses a syringe and a needle to inject a steroid medicine into a painful and inflamed joint. A pain-relieving medicine (anesthetic) may be injected along with the steroid. In some cases, your health care provider may use an imaging technique such as ultrasound or fluoroscopy to guide the injection. Joints that are often treated with steroid injections include the knee, shoulder, hip, and spine. These injections may also be used in the elbow, ankle, and joints of the hands or feet. You may have joint steroid injections as part of your treatment for inflammation caused by: Gout. Rheumatoid arthritis. Advanced wear-and-tear arthritis (osteoarthritis). Tendinitis. Bursitis. Joint steroid injections may be repeated, but having them too often can damage a joint or the skin over the joint. You should not have joint steroid injections less than 6 weeks apart or more than four times a year. Tell a health care provider about: Any allergies you have. All medicines you are taking, including vitamins, herbs, eye drops, creams, and over-the-counter medicines. Any problems you or family members have had with anesthetic medicines. Any blood disorders you have. Any surgeries you have had. Any medical conditions you have. Whether you are pregnant or may be pregnant. What are the risks? Generally, this is a safe treatment. However, problems may occur, including: Infection. Bleeding. Allergic reactions to medicines. Damage to the joint or tissues around the joint. Thinning of skin or loss of skin color over the joint. Temporary flushing of the face or chest. Temporary increase in pain. Temporary increase in blood sugar. Failure to relieve inflammation or pain. What happens before the treatment? Medicines Ask  your health care provider about: Changing or stopping your regular medicines. This is especially important if you are taking diabetes medicines or blood thinners. Taking medicines such as aspirin and ibuprofen. These medicines can thin your blood. Do not take these medicines unless your health care provider tells you to take them. Taking over-the-counter medicines, vitamins, herbs, and supplements. General instructions You may have imaging tests of your joint. Ask your health care provider if you can drive yourself home after the procedure. What happens during the treatment?  Your health care provider will position you for the injection and locate the injection site over your joint. The skin over the joint will be cleaned with a germ-killing soap. Your health care provider may: Spray a numbing solution (topical anesthetic) over the injection site. Inject a local anesthetic under the skin above your joint. The needle will be placed through your skin into your joint. Your health care provider may use imaging to guide the needle to the right spot for the injection. If imaging is used, a special contrast dye may be injected to confirm that the needle is in the correct location. The steroid medicine will be injected into your joint. Anesthetic may be injected along with the steroid. This may be a medicine that relieves pain for a short time (short-acting anesthetic) or for a longer time (long-acting anesthetic). The needle will be removed, and an adhesive bandage (dressing) will be placed over the injection site. The procedure may vary among health care providers and hospitals. What can I expect after the treatment? You will be able to go home after the treatment. It is normal to feel slight flushing for a few days after the injection. After the treatment, it is   common to have an increase in joint pain after the anesthetic has worn off. This may happen about an hour after a short-acting anesthetic  or about 8 hours after a longer-acting anesthetic. You should begin to feel relief from joint pain and swelling after 24 to 48 hours. Contact your health care provider if you do not begin to feel relief after 2 days. Follow these instructions at home: Injection site care Leave the adhesive dressing over your injection site in place until your health care provider says you can remove it. Check your injection site every day for signs of infection. Check for: More redness, swelling, or pain. Fluid or blood. Warmth. Pus or a bad smell. Activity Return to your normal activities as told by your health care provider. Ask your health care provider what activities are safe for you. You may be asked to limit activities that put stress on the joint for a few days. Do joint exercises as told by your health care provider. Do not take baths, swim, or use a hot tub until your health care provider approves. Ask your health care provider if you may take showers. You may only be allowed to take sponge baths. Managing pain, stiffness, and swelling  If directed, put ice on the joint. To do this: Put ice in a plastic bag. Place a towel between your skin and the bag. Leave the ice on for 20 minutes, 2-3 times a day. Remove the ice if your skin turns bright red. This is very important. If you cannot feel pain, heat, or cold, you have a greater risk of damage to the area. Raise (elevate) your joint above the level of your heart when you are sitting or lying down. General instructions Take over-the-counter and prescription medicines only as told by your health care provider. Do not use any products that contain nicotine or tobacco, such as cigarettes, e-cigarettes, and chewing tobacco. These can delay joint healing. If you need help quitting, ask your health care provider. If you have diabetes, be aware that your blood sugar may be slightly elevated for several days after the injection. Keep all follow-up visits.  This is important. Contact a health care provider if you have: Chills or a fever. Any signs of infection at your injection site. Increased pain or swelling or no relief after 2 days. Summary A joint steroid injection is a treatment to relieve pain and swelling in a joint. Steroids are medicines that reduce inflammation. Your health care provider may add an anesthetic along with the steroid. You may have joint steroid injections as part of your arthritis treatment. Joint steroid injections may be repeated, but having them too often can damage a joint or the skin over the joint. Contact your health care provider if you have a fever, chills, or signs of infection, or if you get no relief from joint pain or swelling. This information is not intended to replace advice given to you by your health care provider. Make sure you discuss any questions you have with your health care provider. Document Revised: 11/02/2019 Document Reviewed: 11/02/2019 Elsevier Patient Education  2023 Elsevier Inc.  

## 2022-03-03 ENCOUNTER — Ambulatory Visit: Payer: 59 | Admitting: Family Medicine

## 2022-03-03 ENCOUNTER — Encounter: Payer: Self-pay | Admitting: Family Medicine

## 2022-03-03 VITALS — BP 122/92 | HR 95 | Temp 99.1°F | Wt 170.6 lb

## 2022-03-03 DIAGNOSIS — M19012 Primary osteoarthritis, left shoulder: Secondary | ICD-10-CM

## 2022-03-03 DIAGNOSIS — J439 Emphysema, unspecified: Secondary | ICD-10-CM | POA: Diagnosis not present

## 2022-03-03 DIAGNOSIS — M19011 Primary osteoarthritis, right shoulder: Secondary | ICD-10-CM

## 2022-03-03 MED ORDER — ALBUTEROL SULFATE HFA 108 (90 BASE) MCG/ACT IN AERS: 1.0000 | INHALATION_SPRAY | Freq: Four times a day (QID) | RESPIRATORY_TRACT | 0 refills | Status: DC | PRN

## 2022-03-03 MED ORDER — TRIAMCINOLONE ACETONIDE 40 MG/ML IJ SUSP
40.0000 mg | Freq: Once | INTRAMUSCULAR | Status: AC
  Administered 2022-03-03: 40 mg via INTRA_ARTICULAR

## 2022-03-03 MED ORDER — LIDOCAINE HCL 2 % IJ SOLN
1.0000 mL | Freq: Once | INTRAMUSCULAR | Status: AC
  Administered 2022-03-03: 20 mg

## 2022-03-03 NOTE — Patient Instructions (Signed)
Joint Steroid Injection A joint steroid injection is a procedure to relieve swelling and pain in a joint. Steroids are medicines that reduce inflammation. In this procedure, your health care provider uses a syringe and a needle to inject a steroid medicine into a painful and inflamed joint. A pain-relieving medicine (anesthetic) may be injected along with the steroid. In some cases, your health care provider may use an imaging technique such as ultrasound or fluoroscopy to guide the injection. Joints that are often treated with steroid injections include the knee, shoulder, hip, and spine. These injections may also be used in the elbow, ankle, and joints of the hands or feet. You may have joint steroid injections as part of your treatment for inflammation caused by: Gout. Rheumatoid arthritis. Advanced wear-and-tear arthritis (osteoarthritis). Tendinitis. Bursitis. Joint steroid injections may be repeated, but having them too often can damage a joint or the skin over the joint. You should not have joint steroid injections less than 6 weeks apart or more than four times a year. Tell a health care provider about: Any allergies you have. All medicines you are taking, including vitamins, herbs, eye drops, creams, and over-the-counter medicines. Any problems you or family members have had with anesthetic medicines. Any blood disorders you have. Any surgeries you have had. Any medical conditions you have. Whether you are pregnant or may be pregnant. What are the risks? Generally, this is a safe treatment. However, problems may occur, including: Infection. Bleeding. Allergic reactions to medicines. Damage to the joint or tissues around the joint. Thinning of skin or loss of skin color over the joint. Temporary flushing of the face or chest. Temporary increase in pain. Temporary increase in blood sugar. Failure to relieve inflammation or pain. What happens before the treatment? Medicines Ask  your health care provider about: Changing or stopping your regular medicines. This is especially important if you are taking diabetes medicines or blood thinners. Taking medicines such as aspirin and ibuprofen. These medicines can thin your blood. Do not take these medicines unless your health care provider tells you to take them. Taking over-the-counter medicines, vitamins, herbs, and supplements. General instructions You may have imaging tests of your joint. Ask your health care provider if you can drive yourself home after the procedure. What happens during the treatment?  Your health care provider will position you for the injection and locate the injection site over your joint. The skin over the joint will be cleaned with a germ-killing soap. Your health care provider may: Spray a numbing solution (topical anesthetic) over the injection site. Inject a local anesthetic under the skin above your joint. The needle will be placed through your skin into your joint. Your health care provider may use imaging to guide the needle to the right spot for the injection. If imaging is used, a special contrast dye may be injected to confirm that the needle is in the correct location. The steroid medicine will be injected into your joint. Anesthetic may be injected along with the steroid. This may be a medicine that relieves pain for a short time (short-acting anesthetic) or for a longer time (long-acting anesthetic). The needle will be removed, and an adhesive bandage (dressing) will be placed over the injection site. The procedure may vary among health care providers and hospitals. What can I expect after the treatment? You will be able to go home after the treatment. It is normal to feel slight flushing for a few days after the injection. After the treatment, it is   common to have an increase in joint pain after the anesthetic has worn off. This may happen about an hour after a short-acting anesthetic  or about 8 hours after a longer-acting anesthetic. You should begin to feel relief from joint pain and swelling after 24 to 48 hours. Contact your health care provider if you do not begin to feel relief after 2 days. Follow these instructions at home: Injection site care Leave the adhesive dressing over your injection site in place until your health care provider says you can remove it. Check your injection site every day for signs of infection. Check for: More redness, swelling, or pain. Fluid or blood. Warmth. Pus or a bad smell. Activity Return to your normal activities as told by your health care provider. Ask your health care provider what activities are safe for you. You may be asked to limit activities that put stress on the joint for a few days. Do joint exercises as told by your health care provider. Do not take baths, swim, or use a hot tub until your health care provider approves. Ask your health care provider if you may take showers. You may only be allowed to take sponge baths. Managing pain, stiffness, and swelling  If directed, put ice on the joint. To do this: Put ice in a plastic bag. Place a towel between your skin and the bag. Leave the ice on for 20 minutes, 2-3 times a day. Remove the ice if your skin turns bright red. This is very important. If you cannot feel pain, heat, or cold, you have a greater risk of damage to the area. Raise (elevate) your joint above the level of your heart when you are sitting or lying down. General instructions Take over-the-counter and prescription medicines only as told by your health care provider. Do not use any products that contain nicotine or tobacco, such as cigarettes, e-cigarettes, and chewing tobacco. These can delay joint healing. If you need help quitting, ask your health care provider. If you have diabetes, be aware that your blood sugar may be slightly elevated for several days after the injection. Keep all follow-up visits.  This is important. Contact a health care provider if you have: Chills or a fever. Any signs of infection at your injection site. Increased pain or swelling or no relief after 2 days. Summary A joint steroid injection is a treatment to relieve pain and swelling in a joint. Steroids are medicines that reduce inflammation. Your health care provider may add an anesthetic along with the steroid. You may have joint steroid injections as part of your arthritis treatment. Joint steroid injections may be repeated, but having them too often can damage a joint or the skin over the joint. Contact your health care provider if you have a fever, chills, or signs of infection, or if you get no relief from joint pain or swelling. This information is not intended to replace advice given to you by your health care provider. Make sure you discuss any questions you have with your health care provider. Document Revised: 11/02/2019 Document Reviewed: 11/02/2019 Elsevier Patient Education  2023 Elsevier Inc.  

## 2022-03-03 NOTE — Progress Notes (Signed)
[facility] [address] [place] [address] Dept: [phone] Dept Fax: [phone]  Office Visit  Subjective:    Patient ID: Nathaniel George, male    DOB: 11-12-1955, 66 y.o..   MRN: [id number]  Chief Complaint  Patient presents with   Follow-up    2 wks f/u. Rt shoulder is doing well. Exercising that arm.    History of Present Illness:  Patient is in today for consideration for a steroid injection in his left shoulder. I had seen Mr. Fairbank in August with signs of bilateral shoulder arthritis. He returned on 9/11 and we proceeded with a steroid injectio of the right shoulder. He finds this has improved his discomfort and his ROM of the shoulder. He returns today to look at an injection of the left shoulder.  Mr. Jasinski has a history of some mild emphysema. He uses an albuterol inhaler periodically for this. He is unsure if his current inhaler may be out of medicine.  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 No pertinent family history.  Outpatient Medications Prior to Visit  Medication Sig Dispense Refill   Acetaminophen (TYLENOL ARTHRITIS PAIN PO) Take by mouth.     albuterol (VENTOLIN HFA) 108 (90 Base) MCG/ACT inhaler Inhale 1-2 puffs into the lungs every 6 (six) hours as needed for shortness of breath. 6.7 g 0   naltrexone (DEPADE) 50  MG tablet Take 1 tablet (50 mg total) by mouth daily. 30 tablet 5   varenicline (CHANTIX) 1 MG tablet Take 1 tablet (1 mg total) by mouth 2 (two) times daily. 60 tablet 2   No facility-administered medications prior to visit.   No Known Allergies    Objective:   Today's Vitals   03/03/22 0826  BP: (!) 122/92  Pulse: 95  Temp: 99.1 F (37.3 C)  TempSrc: Oral  SpO2: 95%  Weight: 170 lb 9.6 oz (77.4 kg)   Body mass index is 25.19 kg/m.   General: Well developed, well nourished. No acute distress. Extremities: Improved ROM of the right shoulder joint. Bilateral crepitance noted. Psych: Alert and oriented. Normal mood and affect.  Health Maintenance Due  Topic Date Due   HIV Screening  Never done   COLONOSCOPY (Pts 45-78yrs Insurance coverage will need to be confirmed)  Never done   Zoster Vaccines- Shingrix (1 of 2) Never done   INFLUENZA VACCINE  01/05/2022   PROCEDURE- Steroid Joint Injection Indication: Bilateral shoulder osteoarthritis Joint: Left Shoulder Joint Medication:  Triamcinolone 40 mg (1 ml)/lidocaine 2% 20 mg (1 ml)  PARQ reviewed with patient. Consent signed. Injection site cleaned with alcohol. Needle introduced into joint space without difficulty. Medication administered and needle removed. Sterile bandage applied. Joint moved through gentle range of motion. Patient tolerated procedure well. Routine post injection care instructions reviewed and provided to patient.    Assessment & Plan:   1. Bilateral shoulder region arthritis Injection performed as noted  above. Routine instructions given.   - triamcinolone acetonide (KENALOG-40) injection 40 mg - lidocaine (XYLOCAINE) 2 % (with pres) injection 20 mg  2. Pulmonary emphysema, unspecified emphysema type ([place]) We will continue to manage with a SABA.  - albuterol (VENTOLIN HFA) 108 (90 Base) MCG/ACT inhaler; Inhale 1-2 puffs into the lungs every 6 (six) hours as needed for shortness of breath.  Dispense:  6.7 g; Refill: 0   Return in about 3 months (around 06/02/2022) for Reassessment.   Haydee Salter, MD

## 2022-05-03 ENCOUNTER — Ambulatory Visit: Payer: 59 | Admitting: Family Medicine

## 2022-06-02 ENCOUNTER — Telehealth: Payer: Self-pay | Admitting: Family Medicine

## 2022-06-02 ENCOUNTER — Ambulatory Visit: Payer: 59 | Admitting: Family Medicine

## 2022-06-02 NOTE — Telephone Encounter (Signed)
12.27.23 no show letter sent

## 2022-06-11 ENCOUNTER — Encounter: Payer: Self-pay | Admitting: Family Medicine

## 2022-06-11 NOTE — Telephone Encounter (Signed)
2nd no show (01/08/2022 & 04/02/2022). Final warning sent via mail and mychart. Fee generated.

## 2022-06-15 ENCOUNTER — Encounter: Payer: Self-pay | Admitting: Family Medicine

## 2022-06-15 ENCOUNTER — Ambulatory Visit: Payer: 59 | Admitting: Family Medicine

## 2022-06-15 VITALS — BP 124/78 | HR 99 | Temp 97.3°F | Ht 69.0 in | Wt 173.4 lb

## 2022-06-15 DIAGNOSIS — R051 Acute cough: Secondary | ICD-10-CM | POA: Diagnosis not present

## 2022-06-15 LAB — POCT INFLUENZA A/B
Influenza A, POC: NEGATIVE
Influenza B, POC: NEGATIVE

## 2022-06-15 LAB — POC COVID19 BINAXNOW: SARS Coronavirus 2 Ag: NEGATIVE

## 2022-06-15 NOTE — Progress Notes (Signed)
  [facility] [address] [place] [address] Dept: [phone] Dept Fax: [phone]  Office Visit  Subjective:    Patient ID: Nathaniel George, male    DOB: 1955/09/03, 67 y.o..   MRN: [id number]  Chief Complaint  Patient presents with   Acute Visit    C/o having ST x 2 days.  No OTC meds taken.     History of Present Illness:  Patient is in today complaining of a 2-day history of sore throat. He has not had significant nasal congestion or cough. he denies any fever. He has had a decreased appetite. He notes his wife has been sick and she is managing well with hot tea with honey and lemon. He had not taken any particular medications for this.  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 No pertinent family history.  Outpatient Medications Prior to Visit  Medication Sig Dispense Refill   Acetaminophen (TYLENOL ARTHRITIS PAIN PO) Take by mouth.     albuterol (VENTOLIN HFA) 108 (90 Base) MCG/ACT inhaler Inhale 1-2 puffs into the lungs every 6 (six) hours as needed for shortness of breath. 6.7 g 0   naltrexone (DEPADE) 50 MG tablet Take 1 tablet (50 mg total) by mouth daily. 30 tablet 5   varenicline (CHANTIX) 1 MG tablet Take 1 tablet (1 mg total) by mouth 2 (two) times daily. 60 tablet 2   No facility-administered medications prior to visit.   No  Known Allergies    Objective:   Today's Vitals   06/15/22 1559  BP: 124/78  Pulse: 99  Temp: (!) 97.3 F (36.3 C)  TempSrc: Temporal  SpO2: 99%  Weight: 173 lb 6.4 oz (78.7 kg)  Height: 5\' 9"  (1.753 m)   Body mass index is 25.61 kg/m.   General: Well developed, well nourished. No acute distress. HEENT: Normocephalic, non-traumatic. Eyes clear. External ears normal. EAC and TMs normal   bilaterally. Nose clear without congestion or rhinorrhea. Mucous membranes moist. Oropharynx clear.   Good dentition. Neck: Supple. No lymphadenopathy. No thyromegaly. Lungs: Clear to auscultation bilaterally. No wheezing, rales or rhonchi. CV: RRR without murmurs or rubs. Pulses 2+ bilaterally. Psych: Alert and oriented. Normal mood and affect.  Health Maintenance Due  Topic Date Due   HIV Screening  Never done   COLONOSCOPY (Pts 45-22yrs Insurance coverage will need to be confirmed)  Never done   Zoster Vaccines- Shingrix (1 of 2) Never done   Lab Results POCT Covid: Neg. POCT Influenza A& B: Neg.    Assessment & Plan:   1. Acute cough Discussed home care for viral illness, including rest, pushing fluids, and OTC medications as needed for symptom relief. Recommend hot tea with honey for sore throat symptoms. Follow-up if needed for worsening or persistent symptoms.  - POCT Influenza A/B - POC COVID-19  Return if symptoms worsen or fail to improve.   Haydee Salter, MD

## 2023-02-21 IMAGING — DX DG CHEST 2V
2 series · 2 of 2 positions shown · non-contrast
Comparison: October 21, 2016.

CLINICAL DATA: cough and SOB, chronic, tobacco use

EXAM:
CHEST - 2 VIEW

[chest pa]
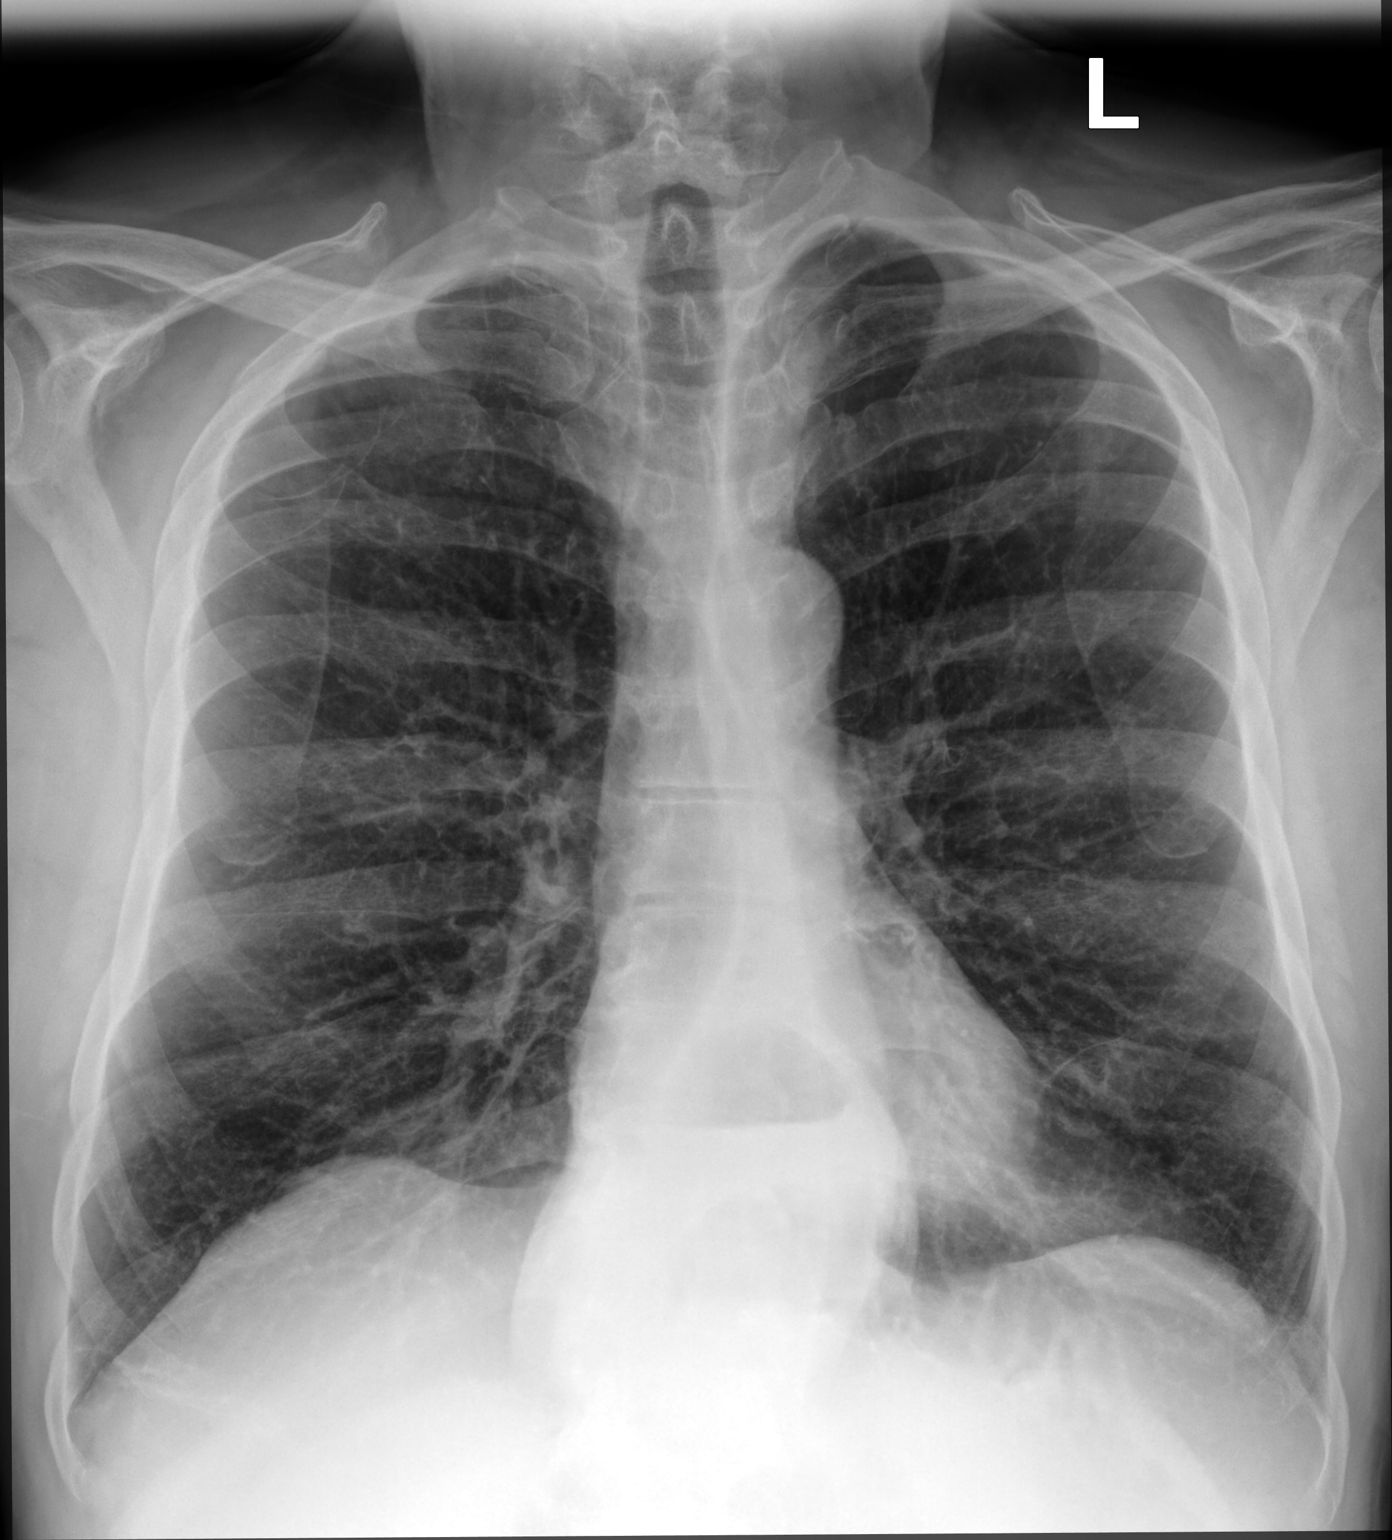

[chest lat]
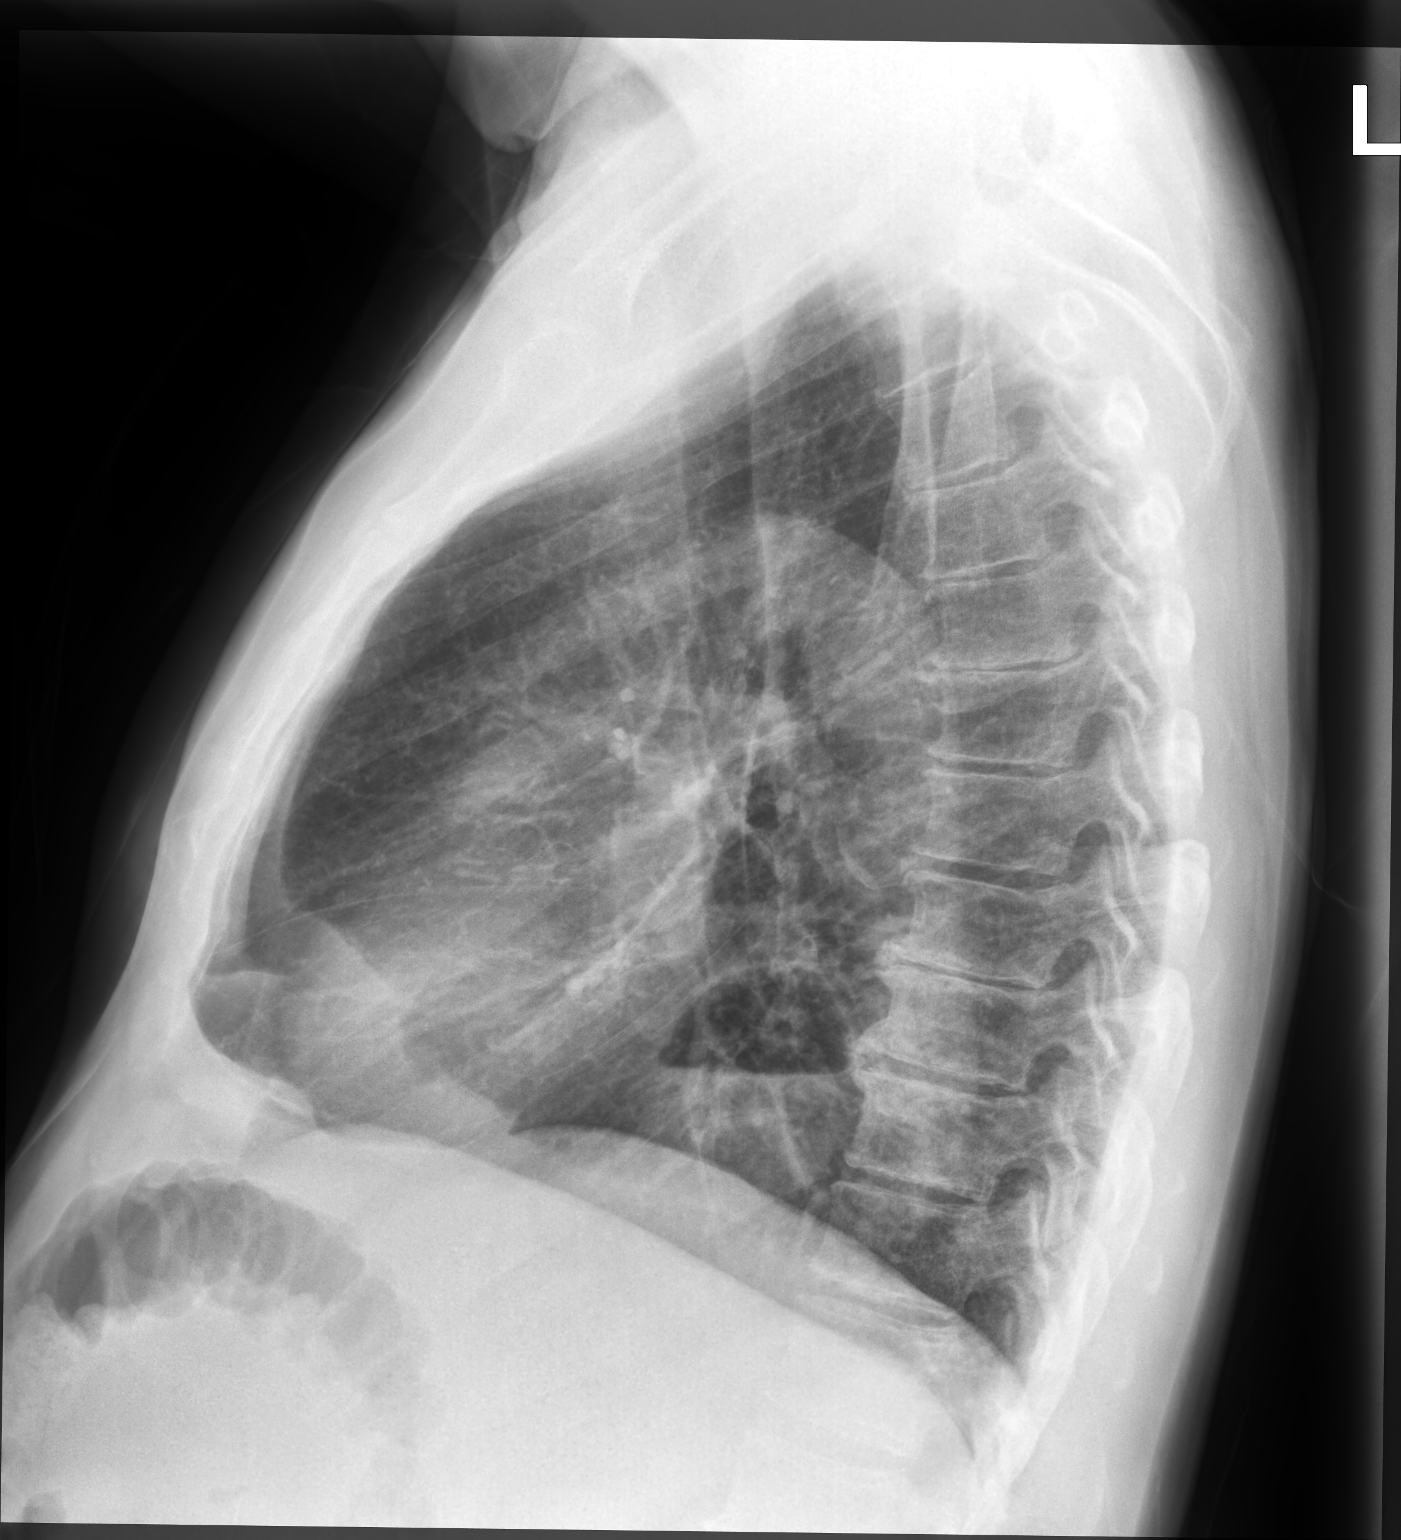

[2 of 2 positions shown; findings below may reference images not displayed]

FINDINGS: No consolidation. No visible pleural effusions or pneumothorax.
Cardiomediastinal silhouette is within normal limits. Moderate
hiatal hernia with air-fluid level. No displaced fracture.
IMPRESSION: 1. No evidence of acute cardiopulmonary disease.
2. Moderate hiatal hernia.
3. Given the patient's reported tobacco use, consider low-dose CT
lung cancer screening if the patient meets criteria (see below).

Low-dose CT lung cancer screening is recommended for patients who
are 50-80 years of age with a 20+ pack-year history of smoking, and
who are currently smoking or quit <=15 years ago.

## 2023-04-28 ENCOUNTER — Encounter: Payer: Self-pay | Admitting: Internal Medicine

## 2023-04-28 ENCOUNTER — Other Ambulatory Visit: Payer: Self-pay

## 2023-04-28 ENCOUNTER — Ambulatory Visit: Payer: 59 | Admitting: Internal Medicine

## 2023-04-28 VITALS — BP 122/80 | HR 91 | Temp 98.0°F | Ht 69.0 in | Wt 171.0 lb

## 2023-04-28 DIAGNOSIS — J439 Emphysema, unspecified: Secondary | ICD-10-CM | POA: Diagnosis not present

## 2023-04-28 DIAGNOSIS — W57XXXA Bitten or stung by nonvenomous insect and other nonvenomous arthropods, initial encounter: Secondary | ICD-10-CM

## 2023-04-28 DIAGNOSIS — F17209 Nicotine dependence, unspecified, with unspecified nicotine-induced disorders: Secondary | ICD-10-CM

## 2023-04-28 DIAGNOSIS — S80869A Insect bite (nonvenomous), unspecified lower leg, initial encounter: Secondary | ICD-10-CM

## 2023-04-28 MED ORDER — ALBUTEROL SULFATE HFA 108 (90 BASE) MCG/ACT IN AERS: 1.0000 | INHALATION_SPRAY | Freq: Four times a day (QID) | RESPIRATORY_TRACT | 1 refills | Status: DC | PRN

## 2023-04-28 MED ORDER — VARENICLINE TARTRATE 1 MG PO TABS: 1.0000 mg | ORAL_TABLET | Freq: Two times a day (BID) | ORAL | 2 refills | Status: DC

## 2023-04-28 MED ORDER — DOXYCYCLINE HYCLATE 100 MG PO TABS: 100.0000 mg | ORAL_TABLET | Freq: Two times a day (BID) | ORAL | 0 refills | Status: AC

## 2023-04-28 MED ORDER — VARENICLINE TARTRATE 1 MG PO TABS: 1.0000 mg | ORAL_TABLET | Freq: Two times a day (BID) | ORAL | 0 refills | Status: DC

## 2023-04-28 MED ORDER — ALBUTEROL SULFATE HFA 108 (90 BASE) MCG/ACT IN AERS: 1.0000 | INHALATION_SPRAY | Freq: Four times a day (QID) | RESPIRATORY_TRACT | 0 refills | Status: DC | PRN

## 2023-04-28 NOTE — Progress Notes (Signed)
[facility] [address] [place] [address] Dept: [phone] Dept Fax: [phone]  Acute Care Office Visit  Subjective:   Nathaniel George 1955-08-20 04/28/2023  Chief Complaint  Patient presents with   Sore    Noticed a couple weeks ago     HPI: Discussed the use of AI scribe software for clinical note transcription with the patient, who gave verbal consent to proceed.  History of Present Illness   The patient presents with three leg sores of three weeks duration. They deny trauma or injury, edema, or fever. They have been applying an antibiotic ointment which has improved the sores minimally. They have been outdoors and had tick bites. They state they had to pull several ticks off their legs. They also need refills on their albuterol inhaler and Chantix.      The following portions of the patient's history were reviewed and updated as appropriate: past medical history, past surgical history, family history, social history, allergies, medications, and problem list.   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 No pertinent family history.  Current Outpatient Medications:    Acetaminophen (TYLENOL ARTHRITIS PAIN PO), Take by mouth., Disp: , Rfl:     doxycycline (VIBRA-TABS) 100 MG tablet, Take 1 tablet (100 mg total) by mouth 2 (two) times daily for 7 days., Disp: 14 tablet, Rfl: 0   albuterol (VENTOLIN HFA) 108 (90 Base) MCG/ACT inhaler, Inhale 1-2 puffs into the lungs every 6 (six) hours as needed for shortness of breath., Disp: 6.7 g, Rfl: 1   naltrexone (DEPADE) 50 MG tablet, Take 1 tablet (50 mg total) by mouth daily. (Patient not taking: Reported on 04/28/2023), Disp: 30 tablet, Rfl: 5   varenicline (CHANTIX) 1 MG tablet, Take 1 tablet (1 mg total) by mouth 2 (two) times daily., Disp: 60 tablet, Rfl: 2 No Known Allergies   ROS: A complete ROS was performed with pertinent positives/negatives noted in the HPI. The remainder of the ROS are negative.    Objective:   Today's Vitals   04/28/23 0809  BP: 122/80  Pulse: 91  Temp: 98 F (36.7 C)  TempSrc: Temporal  SpO2: 95%  Weight: 171 lb (77.6 kg)  Height: 5\' 9"  (1.753 m)    GENERAL: Well-appearing, in NAD. Well nourished.  SKIN: Pink, warm and dry. Single circular erythematous raised lesion below R. Knee cap, and 2 circular flat erythematous lesions to lateral aspect of left calf.  RESPIRATORY: Chest wall symmetrical. Respirations even and non-labored.  CARDIAC: Peripheral pulses 2+ bilaterally.  MSK: Muscle tone and strength appropriate for age. Joints w/o tenderness, redness, or swelling. EXTREMITIES: Without clubbing, cyanosis, or edema.  NEUROLOGIC: Steady, even gait.  PSYCH/MENTAL STATUS: Alert, oriented x 3. Cooperative, appropriate mood and affect.    No results found for any visits on 04/28/23.  Assessment & Plan:  Assessment and Plan    Insect Bite Three-week history of three skin lesions on the legs, possibly related to tick bites. No associated fever or swelling. Lesions are improving with topical antibiotic ointment. -Start Doxycycline to cover for possible tick-borne illnesses. -Continue topical antibiotic ointment.  Medication Refills Patient requires  refills for Albuterol inhaler and Chantix. -Refill Albuterol inhaler. -Refill Chantix      Meds ordered this encounter  Medications   doxycycline (VIBRA-TABS) 100 MG tablet    Sig: Take 1 tablet (100 mg total) by mouth 2 (two) times daily for 7 days.    Dispense:  14 tablet    Refill:  0    Order Specific Question:   Supervising Provider    Answer:   Garnette Gunner [[id number]]   albuterol (VENTOLIN HFA) 108 (90 Base) MCG/ACT inhaler    Sig: Inhale 1-2 puffs into the lungs every 6 (six) hours as needed for shortness of breath.    Dispense:  6.7 g    Refill:  1    Order Specific Question:   Supervising Provider    Answer:   Garnette Gunner [[id number]]   varenicline (CHANTIX) 1 MG tablet    Sig: Take 1 tablet (1 mg total) by mouth 2 (two) times daily.    Dispense:  60 tablet    Refill:  2    Order Specific Question:   Supervising Provider    Answer:   Garnette Gunner [[id number]]   Lab Orders  No laboratory test(s) ordered today   No images are attached to the encounter or orders placed in the encounter.  Return in about 1 month (around 05/28/2023) for Chronic Condition follow up with Dr. Veto Kemps.   Salvatore Decent, FNP

## 2023-06-09 ENCOUNTER — Encounter: Payer: Self-pay | Admitting: Family Medicine

## 2023-06-09 ENCOUNTER — Ambulatory Visit: Payer: 59 | Admitting: Family Medicine

## 2023-06-09 VITALS — BP 128/80 | HR 97 | Temp 97.2°F | Ht 69.0 in | Wt 170.2 lb

## 2023-06-09 DIAGNOSIS — L309 Dermatitis, unspecified: Secondary | ICD-10-CM | POA: Diagnosis not present

## 2023-06-09 DIAGNOSIS — Z23 Encounter for immunization: Secondary | ICD-10-CM | POA: Diagnosis not present

## 2023-06-09 DIAGNOSIS — F17209 Nicotine dependence, unspecified, with unspecified nicotine-induced disorders: Secondary | ICD-10-CM

## 2023-06-09 DIAGNOSIS — J069 Acute upper respiratory infection, unspecified: Secondary | ICD-10-CM | POA: Diagnosis not present

## 2023-06-09 MED ORDER — TRIAMCINOLONE ACETONIDE 0.1 % EX OINT: 1.0000 | TOPICAL_OINTMENT | Freq: Two times a day (BID) | CUTANEOUS | 0 refills | Status: DC

## 2023-06-09 NOTE — Progress Notes (Signed)
 [facility] [address] [place] [address] Dept: [phone] Dept Fax: [phone]  Office Visit  Subjective:    Patient ID: Nathaniel George, male    DOB: 1956-05-17, 68 y.o..   MRN: [id number]  Chief Complaint  Patient presents with   Cough    C/o having cough 2-3 days.  Have taken Mucinex .    History of Present Illness:  Patient is in today noting a continued area of skin rash on his right lower leg, below the knee. He was seen in Nov. with what he thought was insect bites. He was treated with a course of doxycycline . He notes he has continued to have a scaly patch. He has tried applying a cream his wife has at home, which she uses on her feet. He denies any similar patches elsewhere.  Nathaniel George also has had a cough for 3 days. He notes that his granddaughter had recently been diagnosed with walking pneumonia. He is coughing up sputum, but this has not changed in color. he is not running fever. He has been using Mucinex  and lemon with honey. Nathaniel George has a history of emphysema and smokes 1/2 ppd of cigarettes.  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 No pertinent family history. Outpatient Medications Prior to Visit  Medication Sig Dispense Refill   Acetaminophen  (TYLENOL  ARTHRITIS PAIN PO) Take by mouth.     albuterol  (VENTOLIN  HFA)  108 (90 Base) MCG/ACT inhaler Inhale 1-2 puffs into the lungs every 6 (six) hours as needed for shortness of breath. 6.7 g 1   varenicline  (CHANTIX ) 1 MG tablet Take 1 tablet (1 mg total) by mouth 2 (two) times daily. 60 tablet 2   naltrexone  (DEPADE) 50 MG tablet Take 1 tablet (50 mg total) by mouth daily. (Patient not taking: Reported on 04/28/2023) 30 tablet 5   No facility-administered medications prior to visit.   No Known Allergies   Objective:   Today's Vitals   06/09/23 0753  BP: 128/80  Pulse: 97  Temp: (!) 97.2 F (36.2 C)  TempSrc: Temporal  SpO2: 100%  Weight: 170 lb 3.2 oz (77.2 kg)  Height: 5' 9 (1.753 m)   Body mass index is 25.13 kg/m.   General: Well developed, well nourished. No acute distress. Lungs: Clear to auscultation bilaterally. No wheezing, rales or rhonchi. Skin: Warm and dry. Are of scaly rash on the right lower leg, below the knee. No sing of crusting or purulent drainage.     Psych: Alert and oriented. Normal mood and affect.  Health Maintenance Due  Topic Date Due   HIV Screening  Never done   Colonoscopy  Never done   Zoster Vaccines- Shingrix (1 of 2) Never done   Pneumonia Vaccine 8+ Years old (2 of 2 - PCV) 10/29/2018   INFLUENZA VACCINE  01/06/2023     Assessment & Plan:   Problem List Items Addressed This Visit  Respiratory   Viral URI with cough   Discussed home care for viral illness, including rest, pushing fluids, and OTC medications as needed for symptom relief. Recommend hot tea with honey for sore throat symptoms. Follow-up if needed for worsening or persistent symptoms.         Other   Tobacco use disorder, continuous   Continue to recommend smoking cessaiton. Due for annual LDCT for lung cancer screening.       Relevant Orders   CT CHEST LUNG CANCER SCREENING LOW DOSE WO CONTRAST   Other Visit Diagnoses       Eczema, unspecified type    -  Primary   Rash appears eczematous, though I would consider  localized psoriasis. I will treat with triamcinolone  ointment bid.   Relevant Medications   triamcinolone  ointment (KENALOG ) 0.1 %     Need for pneumococcal 20-valent conjugate vaccination       Relevant Orders   Pneumococcal conjugate vaccine 20-valent      Return if symptoms worsen or fail to improve.   Garnette CHRISTELLA Simpler, MD

## 2023-06-09 NOTE — Assessment & Plan Note (Signed)
 Continue to recommend smoking cessaiton. Due for annual LDCT for lung cancer screening.

## 2023-06-09 NOTE — Assessment & Plan Note (Signed)
 Discussed home care for viral illness, including rest, pushing fluids, and OTC medications as needed for symptom relief. Recommend hot tea with honey for sore throat symptoms. Follow-up if needed for worsening or persistent symptoms.

## 2023-09-10 ENCOUNTER — Telehealth: Admitting: Nurse Practitioner

## 2023-09-10 DIAGNOSIS — M1000 Idiopathic gout, unspecified site: Secondary | ICD-10-CM

## 2023-09-10 MED ORDER — PREDNISONE 20 MG PO TABS
40.0000 mg | ORAL_TABLET | Freq: Every day | ORAL | 0 refills | Status: AC
Start: 2023-09-10 — End: 2023-09-17

## 2023-09-10 NOTE — Progress Notes (Signed)
E-Visit for Gout Symptoms  We are sorry that you are not feeling well. We are here to help!  Based on what you shared with me it looks like you have a flare of your gout.  Gout is a form of arthritis. It can cause pain and swelling in the joints. At first, it tends to affect only 1 joint - most frequently the big toe. It happens in people who have too much uric acid in the blood. Uric acid is a chemical that is produced when the body breaks down certain foods. Uric acid can form sharp needle-like crystals that build up in the joints and cause pain. Uric acid crystals can also form inside the tubes that carry urine from the kidneys to the bladder. These crystals can turn into "kidney stones" that can cause pain and problems with the flow of urine. People with gout get sudden "flares" or attacks of severe pain, most often the big toe, ankle, or knee. Often the joint also turns red and swells. Usually, only 1 joint is affected, but some people have pain in more than 1 joint. Gout flares tend to happen more often during the night.  The pain from gout can be extreme. The pain and swelling are worst at the beginning of a gout flare. The symptoms then get better within a few days to weeks. It is not clear how the body "turns off" a gout flare.  Do not start any NEW preventative medicine until the gout has cleared completely. However, If you are already on Probenecid or Allopurinol for CHRONIC gout, you may continue taking this during an active flare up  I have prescribed Prednisone 40 mg daily for 7 days.    HOME CARE Losing weight can help relieve gout. It's not clear that following a specific diet plan will help with gout symptoms but eating a balanced diet can help improve your overall health. It can also help you lose weight, if you are overweight. In general, a healthy diet includes plenty of fruits, vegetables, whole grains, and low-fat dairy products (labelled "low fat", skim, 2%). Avoid sugar  sweetened drinks (including sodas, tea, juice and juice blends, coffee drinks and sports drinks) Limit alcohol to 1-2 drinks of beer, spirits or wine daily these can make gout flares worse. Some people with gout also have other health problems, such as heart disease, high blood pressure, kidney disease, or obesity. If you have any of these issues, it's important to work with your doctor to manage them. This can help improve your overall health and might also help with your gout.  GET HELP RIGHT AWAY IF: Your symptoms persist after you have completed your treatment plan You develop severe diarrhea You develop abnormal sensations  You develop vomiting,   You develop weakness  You develop abdominal pain  FOLLOW UP WITH YOUR PRIMARY PROVIDER IF: If your symptoms do not improve within 10 days  MAKE SURE YOU  Understand these instructions. Will watch your condition. Will get help right away if you are not doing well or get worse.  Thank you for choosing an e-visit.  Your e-visit answers were reviewed by a board certified advanced clinical practitioner to complete your personal care plan. Depending upon the condition, your plan could have included both over the counter or prescription medications.  Please review your pharmacy choice. Make sure the pharmacy is open so you can pick up prescription now. If there is a problem, you may contact your provider through MyChart messaging   and have the prescription routed to another pharmacy.  Your safety is important to us. If you have drug allergies check your prescription carefully.   For the next 24 hours you can use MyChart to ask questions about today's visit, request a non-urgent call back, or ask for a work or school excuse. You will get an email in the next two days asking about your experience. I hope that your e-visit has been valuable and will speed your recovery.  

## 2023-09-10 NOTE — Progress Notes (Signed)
 I have spent 5 minutes in review of e-visit questionnaire, review and updating patient chart, medical decision making and response to patient.   Claiborne Rigg, NP

## 2023-10-13 ENCOUNTER — Ambulatory Visit: Payer: Self-pay

## 2023-10-13 NOTE — Telephone Encounter (Signed)
 Copied from CRM [id number]. Topic: Clinical - Medication Question >> Oct 13, 2023 11:54 AM Chuck Crater wrote: Reason for CRM: Patient states that Gout medication is helping but it is still sore.Patient wants to know if another medication can be called in.  Chief Complaint: gout in left foot and left big toe; states it is better from taking the medication prescribed two weeks ago, but redness and pain have not gone away.  Asking for a new med to be called in.  Symptoms: redness, pain to left big toe Frequency: constant Pertinent Negatives: Patient denies fever, leg pain, numbness, weakness Disposition: [] ED /[] Urgent Care (no appt availability in office) / [] Appointment(In office/virtual)/ []  [facility]/ [x] Home Care/ [] Refused Recommended Disposition /[]  Mobile Bus/ []  Follow-up with PCP Additional Notes: was seen two weeks ago and started medication for gout; states toe is better but redness and pain have not gone away; requesting a new med be called in.   Reason for Disposition  Foot pain  Answer Assessment - Initial Assessment Questions 1. ONSET: "When did the pain start?"      2 weeks ago 2. LOCATION: "Where is the pain located?"      Left foot, big toe still has not gone away, red and painful 3. PAIN: "How bad is the pain?"    (Scale 1-10; or mild, moderate, severe)  - MILD (1-3): doesn't interfere with normal activities.   - MODERATE (4-7): interferes with normal activities (e.g., work or school) or awakens from sleep, limping.   - SEVERE (8-10): excruciating pain, unable to do any normal activities, unable to walk.      States a lot better but still sore 4. WORK OR EXERCISE: "Has there been any recent work or exercise that involved this part of the body?"      Na, gout 5. CAUSE: "What do you think is causing the foot pain?"     gout 6. OTHER SYMPTOMS: "Do you have any other symptoms?" (e.g., leg pain, rash, fever, numbness)     denies 7. PREGNANCY: "Is there  any chance you are pregnant?" "When was your last menstrual period?"     na  Protocols used: Foot Pain-A-AH

## 2023-10-13 NOTE — Telephone Encounter (Signed)
 Patient notified that he needed an appointment.  Scheduled him with Dr Tilmon Font on 10/17/23 @ 1:40 pm. Dm/cma

## 2023-10-17 ENCOUNTER — Encounter: Payer: Self-pay | Admitting: Family Medicine

## 2023-10-17 ENCOUNTER — Ambulatory Visit (INDEPENDENT_AMBULATORY_CARE_PROVIDER_SITE_OTHER): Admitting: Family Medicine

## 2023-10-17 VITALS — BP 122/74 | HR 100 | Temp 97.7°F | Ht 69.0 in | Wt 163.6 lb

## 2023-10-17 DIAGNOSIS — M7750 Other enthesopathy of unspecified foot: Secondary | ICD-10-CM

## 2023-10-17 LAB — URIC ACID: Uric Acid, Serum: 9.1 mg/dL — ABNORMAL HIGH (ref 4.0–7.8)

## 2023-10-17 MED ORDER — INDOMETHACIN 50 MG PO CAPS: 50.0000 mg | ORAL_CAPSULE | Freq: Three times a day (TID) | ORAL | 0 refills | Status: AC

## 2023-10-17 MED ORDER — COLCHICINE 0.6 MG PO TABS: 0.6000 mg | ORAL_TABLET | Freq: Two times a day (BID) | ORAL | 0 refills | Status: DC

## 2023-10-17 NOTE — Progress Notes (Signed)
 Established Patient Office Visit   Subjective:  Patient ID: Nathaniel George, male    DOB: 18-Apr-1956  Age: 68 y.o. MRN: [id number]  Chief Complaint  Patient presents with   Toe Pain    Pt complains of left great toe pain x 3 weeks. Pt has edema, redness, Non painful.     Toe Pain  Pertinent negatives include no tingling.   Encounter Diagnoses  Name Primary?   Capsulitis of metatarsophalangeal (MTP) joint Yes   Follow-up of acute episode of pain in his left first MTP a little over a month ago.  Tentative diagnosis of gout flare.  Was given 7 days of prednisone  40.  Most of the discomfort in his past.  The joint remains a little swollen.  No family history of gout that he is aware of at this time.  He is a [employer].  History of alcohol use.   Review of Systems  Constitutional: Negative.   HENT: Negative.    Eyes:  Negative for blurred vision, discharge and redness.  Respiratory: Negative.    Cardiovascular: Negative.   Gastrointestinal:  Negative for abdominal pain.  Genitourinary: Negative.   Musculoskeletal: Negative.  Negative for joint pain and myalgias.  Skin:  Negative for rash.  Neurological:  Negative for tingling, loss of consciousness and weakness.  Endo/Heme/Allergies:  Negative for polydipsia.     Current Outpatient Medications:    Acetaminophen  (TYLENOL  ARTHRITIS PAIN PO), Take by mouth., Disp: , Rfl:    albuterol  (VENTOLIN  HFA) 108 (90 Base) MCG/ACT inhaler, Inhale 1-2 puffs into the lungs every 6 (six) hours as needed for shortness of breath., Disp: 6.7 g, Rfl: 1   colchicine 0.6 MG tablet, Take 1 tablet (0.6 mg total) by mouth 2 (two) times daily for 7 days., Disp: 14 tablet, Rfl: 0   indomethacin (INDOCIN) 50 MG capsule, Take 1 capsule (50 mg total) by mouth 3 (three) times daily with meals for 7 days., Disp: 21 capsule, Rfl: 0   varenicline  (CHANTIX ) 1 MG tablet, Take 1 tablet (1 mg total) by mouth 2 (two) times daily., Disp: 60 tablet, Rfl: 2    triamcinolone  ointment (KENALOG ) 0.1 %, Apply 1 Application topically 2 (two) times daily. (Patient not taking: Reported on 10/17/2023), Disp: 30 g, Rfl: 0   Objective:     BP 122/74 (BP Location: Right Arm, Cuff Size: Normal)   Pulse 100   Temp 97.7 F (36.5 C) (Temporal)   Ht 5\' 9"  (1.753 m)   Wt 163 lb 9.6 oz (74.2 kg)   SpO2 95%   BMI 24.16 kg/m    Physical Exam Constitutional:      General: He is not in acute distress.    Appearance: Normal appearance. He is not ill-appearing, toxic-appearing or diaphoretic.  HENT:     Head: Normocephalic and atraumatic.     Right Ear: External ear normal.     Left Ear: External ear normal.  Eyes:     General: No scleral icterus.       Right eye: No discharge.        Left eye: No discharge.     Extraocular Movements: Extraocular movements intact.     Conjunctiva/sclera: Conjunctivae normal.  Pulmonary:     Effort: Pulmonary effort is normal. No respiratory distress.  Musculoskeletal:     Left foot: Normal capillary refill. Swelling present. No tenderness or bony tenderness.     Comments:     First MTP of left foot remains mildly swollen  with minimal erythema.  There is mild tenderness to palpation.  Skin:    General: Skin is warm and dry.  Neurological:     Mental Status: He is alert and oriented to person, place, and time.  Psychiatric:        Mood and Affect: Mood normal.        Behavior: Behavior normal.      No results found for any visits on 10/17/23.    The ASCVD Risk score (Arnett DK, et al., 2019) failed to calculate for the following reasons:   Cannot find a previous HDL lab   Cannot find a previous total cholesterol lab    Assessment & Plan:   Capsulitis of metatarsophalangeal (MTP) joint -     Uric acid -     Colchicine; Take 1 tablet (0.6 mg total) by mouth 2 (two) times daily for 7 days.  Dispense: 14 tablet; Refill: 0 -     Indomethacin; Take 1 capsule (50 mg total) by mouth 3 (three) times daily with  meals for 7 days.  Dispense: 21 capsule; Refill: 0    Return Schedule follow-up with Dr. Therese Flash.  With lingering swelling and mild tenderness we will treat with a 7-day course of Indocin and colchicine.  Tonna Frederic, MD

## 2023-10-18 ENCOUNTER — Ambulatory Visit: Payer: Self-pay

## 2024-04-30 ENCOUNTER — Ambulatory Visit (INDEPENDENT_AMBULATORY_CARE_PROVIDER_SITE_OTHER): Admitting: Family Medicine

## 2024-04-30 ENCOUNTER — Encounter: Payer: Self-pay | Admitting: Family Medicine

## 2024-04-30 VITALS — BP 134/70 | HR 84 | Temp 97.9°F | Ht 69.0 in | Wt 171.0 lb

## 2024-04-30 DIAGNOSIS — J439 Emphysema, unspecified: Secondary | ICD-10-CM

## 2024-04-30 DIAGNOSIS — F17209 Nicotine dependence, unspecified, with unspecified nicotine-induced disorders: Secondary | ICD-10-CM

## 2024-04-30 DIAGNOSIS — Z23 Encounter for immunization: Secondary | ICD-10-CM

## 2024-04-30 MED ORDER — ALBUTEROL SULFATE HFA 108 (90 BASE) MCG/ACT IN AERS: 1.0000 | INHALATION_SPRAY | Freq: Four times a day (QID) | RESPIRATORY_TRACT | 3 refills | Status: AC | PRN

## 2024-04-30 NOTE — Assessment & Plan Note (Addendum)
 Continue to recommend strongly recommend smoking cessation. I reviewed with Nathaniel George about the approach to using NRT to assist with smoking cessation. I recommend he start with a nicotine  patch 21 mg/day patch, reducing in steps for 3 months. We also discussed using nicotine  gum 2 mg as needed for breakthrough urges. Past due for annual LDCT for lung cancer screening. I will reorder this.  I spent 6 minutes counseling the patient about tobacco cessation.

## 2024-04-30 NOTE — Progress Notes (Signed)
 [facility] [address] [place] [address] Dept: [phone] Dept Fax: [phone]  Chronic Care Office Visit  Subjective:    Patient ID: Nathaniel George, male    DOB: [birth year], 68 y.o..   MRN: [id number]  Chief Complaint  Patient presents with   Follow-up    F/u meds.  No concerns.    History of Present Illness:  Patient is in today for reassessment of chronic medical conditions.   Mr. Mo has a history of mild emphysema. He has mild morning cough. He denies any dyspnea or shortness of breath.  He uses an albuterol  inhaler periodically.  Mr. Utsey smokes about 1 ppd of cigarettes. He had tried taking Chantix , but found this made him dizzy. He has continued to take this sporadically, but it has not helped him quit smoking.  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 Outpatient Medications Prior to Visit  Medication Sig Dispense Refill   Acetaminophen  (TYLENOL  ARTHRITIS PAIN PO) Take by mouth.     albuterol  (VENTOLIN  HFA) 108 (90 Base) MCG/ACT inhaler Inhale 1-2 puffs into the lungs every 6 (six) hours as needed for shortness of breath. 6.7 g 1   colchicine  0.6 MG tablet Take 1 tablet (0.6 mg total) by mouth 2 (two) times daily for 7 days. 14 tablet 0   triamcinolone  ointment (KENALOG ) 0.1 % Apply 1  Application topically 2 (two) times daily. (Patient not taking: Reported on 10/17/2023) 30 g 0   varenicline  (CHANTIX ) 1 MG tablet Take 1 tablet (1 mg total) by mouth 2 (two) times daily. 60 tablet 2   No facility-administered medications prior to visit.   No Known Allergies   Objective:   Today's Vitals   04/30/24 1620 04/30/24 1646  BP: 136/80 134/70  Pulse: 84   Temp: 97.9 F (36.6 C)   TempSrc: Temporal   SpO2: 99%   Weight: 171 lb (77.6 kg)   Height: 5' 9 (1.753 m)    Body mass index is 25.25 kg/m.   General: Well developed, well nourished. No acute distress. Psych: Alert and oriented. Normal mood and affect.  Health Maintenance Due  Topic Date Due   Colonoscopy  Never done   Zoster Vaccines- Shingrix (1 of 2) Never done   Influenza Vaccine  01/06/2024   COVID-19 Vaccine (6 - 2025-26 season) 02/06/2024     Assessment & Plan:   Problem List Items Addressed This Visit       Respiratory   Pulmonary emphysema (HCC) - Primary   Based on symptoms, this is mild. Discussed concern for progressive lung and heart disease with time. Continue albuterol  as needed. Strongly recommend focus on smoking cessation.      Relevant Medications   albuterol  (VENTOLIN  HFA) 108 (90 Base) MCG/ACT inhaler     Other   Tobacco use disorder, continuous   Continue to recommend strongly  recommend smoking cessation. I reviewed with Mr. Wuthrich about the approach to using NRT to assist with smoking cessation. I recommend he start with a nicotine  patch 21 mg/day patch, reducing in steps for 3 months. We also discussed using nicotine  gum 2 mg as needed for breakthrough urges. Past due for annual LDCT for lung cancer screening. I will reorder this.      Relevant Orders   CT CHEST LUNG CANCER SCREENING LOW DOSE WO CONTRAST   Other Visit Diagnoses       Need for immunization against influenza       Relevant Orders   Flu vaccine HIGH DOSE PF(Fluzone Trivalent) (Completed)       Return in  about 6 months (around 10/28/2024) for Reassessment.   Garnette CHRISTELLA Simpler, MD  I,Emily Lagle,acting as a scribe for Garnette CHRISTELLA Simpler, MD.,have documented all relevant documentation on the behalf of Garnette CHRISTELLA Simpler, MD.  I, Garnette CHRISTELLA Simpler, MD, have reviewed all documentation for this visit. The documentation on 04/30/2024 for the exam, diagnosis, procedures, and orders are all accurate and complete.

## 2024-04-30 NOTE — Assessment & Plan Note (Signed)
 Based on symptoms, this is mild. Discussed concern for progressive lung and heart disease with time. Continue albuterol  as needed. Strongly recommend focus on smoking cessation.

## 2024-10-30 ENCOUNTER — Ambulatory Visit: Admitting: Family Medicine
